# Patient Record
Sex: Female | Born: 1965 | Race: White | Hispanic: No | Marital: Single | Smoking: Never smoker
Health system: Southern US, Community
[De-identification: ages and names within clinical notes are randomized; demographics above are authoritative.]

## PROBLEM LIST (undated history)

## (undated) DIAGNOSIS — Z8489 Family history of other specified conditions: Secondary | ICD-10-CM

## (undated) DIAGNOSIS — Z8709 Personal history of other diseases of the respiratory system: Secondary | ICD-10-CM

## (undated) DIAGNOSIS — R519 Headache, unspecified: Secondary | ICD-10-CM

## (undated) DIAGNOSIS — E785 Hyperlipidemia, unspecified: Secondary | ICD-10-CM

## (undated) DIAGNOSIS — N920 Excessive and frequent menstruation with regular cycle: Secondary | ICD-10-CM

## (undated) DIAGNOSIS — D649 Anemia, unspecified: Secondary | ICD-10-CM

## (undated) DIAGNOSIS — N83201 Unspecified ovarian cyst, right side: Secondary | ICD-10-CM

## (undated) DIAGNOSIS — E039 Hypothyroidism, unspecified: Secondary | ICD-10-CM

## (undated) HISTORY — DX: Personal history of other diseases of the respiratory system: Z87.09

## (undated) HISTORY — DX: Hyperlipidemia, unspecified: E78.5

## (undated) HISTORY — DX: Hypothyroidism, unspecified: E03.9

## (undated) HISTORY — PX: OTHER SURGICAL HISTORY: SHX169

---

## 1998-04-06 ENCOUNTER — Other Ambulatory Visit: Admission: RE | Admit: 1998-04-06 | Discharge: 1998-04-06 | Payer: Self-pay | Admitting: Obstetrics and Gynecology

## 2003-10-29 ENCOUNTER — Other Ambulatory Visit: Admission: RE | Admit: 2003-10-29 | Discharge: 2003-10-29 | Payer: Self-pay | Admitting: Family Medicine

## 2005-12-28 ENCOUNTER — Ambulatory Visit: Payer: Self-pay | Admitting: Pulmonary Disease

## 2006-01-04 ENCOUNTER — Ambulatory Visit: Payer: Self-pay | Admitting: Pulmonary Disease

## 2006-01-08 ENCOUNTER — Other Ambulatory Visit: Admission: RE | Admit: 2006-01-08 | Discharge: 2006-01-08 | Payer: Self-pay | Admitting: Family Medicine

## 2006-05-10 ENCOUNTER — Ambulatory Visit: Payer: Self-pay | Admitting: Pulmonary Disease

## 2006-05-10 LAB — CONVERTED CEMR LAB: TSH: 1.95 microintl units/mL (ref 0.35–5.50)

## 2007-06-04 ENCOUNTER — Ambulatory Visit: Payer: Self-pay | Admitting: Pulmonary Disease

## 2007-06-04 DIAGNOSIS — E039 Hypothyroidism, unspecified: Secondary | ICD-10-CM | POA: Insufficient documentation

## 2007-06-04 DIAGNOSIS — J309 Allergic rhinitis, unspecified: Secondary | ICD-10-CM | POA: Insufficient documentation

## 2007-06-08 DIAGNOSIS — J45909 Unspecified asthma, uncomplicated: Secondary | ICD-10-CM | POA: Insufficient documentation

## 2007-06-11 ENCOUNTER — Ambulatory Visit: Payer: Self-pay | Admitting: Pulmonary Disease

## 2007-06-27 ENCOUNTER — Telehealth (INDEPENDENT_AMBULATORY_CARE_PROVIDER_SITE_OTHER): Payer: Self-pay | Admitting: *Deleted

## 2007-07-26 LAB — CONVERTED CEMR LAB
ALT: 19 units/L (ref 0–35)
AST: 24 units/L (ref 0–37)
Albumin: 3.9 g/dL (ref 3.5–5.2)
Alkaline Phosphatase: 48 units/L (ref 39–117)
BUN: 13 mg/dL (ref 6–23)
Basophils Absolute: 0 10*3/uL (ref 0.0–0.1)
Basophils Relative: 0.1 % (ref 0.0–1.0)
Bilirubin Urine: NEGATIVE
Bilirubin, Direct: 0.1 mg/dL (ref 0.0–0.3)
CO2: 30 meq/L (ref 19–32)
Calcium: 8.8 mg/dL (ref 8.4–10.5)
Chloride: 102 meq/L (ref 96–112)
Cholesterol: 194 mg/dL (ref 0–200)
Creatinine, Ser: 0.6 mg/dL (ref 0.4–1.2)
Crystals: NEGATIVE
Eosinophils Absolute: 0.5 10*3/uL (ref 0.0–0.6)
Eosinophils Relative: 8.3 % — ABNORMAL HIGH (ref 0.0–5.0)
GFR calc Af Amer: 142 mL/min
GFR calc non Af Amer: 118 mL/min
Glucose, Bld: 96 mg/dL (ref 70–99)
HCT: 35.8 % — ABNORMAL LOW (ref 36.0–46.0)
HDL: 47.4 mg/dL (ref >39.0)
Hemoglobin: 12.4 g/dL (ref 12.0–15.0)
Ketones, ur: NEGATIVE mg/dL
LDL Cholesterol: 127 mg/dL — ABNORMAL HIGH (ref 0–99)
Lymphocytes Relative: 28 % (ref 12.0–46.0)
MCHC: 34.6 g/dL (ref 30.0–36.0)
MCV: 82.8 fL (ref 78.0–100.0)
Monocytes Absolute: 0.4 10*3/uL (ref 0.2–0.7)
Monocytes Relative: 6.8 % (ref 3.0–11.0)
Neutro Abs: 3.6 10*3/uL (ref 1.4–7.7)
Neutrophils Relative %: 56.8 % (ref 43.0–77.0)
Nitrite: NEGATIVE
Platelets: 263 10*3/uL (ref 150–400)
Potassium: 3.4 meq/L — ABNORMAL LOW (ref 3.5–5.1)
RBC: 4.32 M/uL (ref 3.87–5.11)
RDW: 12.5 % (ref 11.5–14.6)
Sodium: 139 meq/L (ref 135–145)
Specific Gravity, Urine: 1.015 (ref 1.000–1.03)
T4, Total: 7.4 ug/dL (ref 5.0–12.5)
TSH: 3.79 microintl units/mL (ref 0.35–5.50)
Total Bilirubin: 0.5 mg/dL (ref 0.3–1.2)
Total CHOL/HDL Ratio: 4.1
Total Protein, Urine: NEGATIVE mg/dL
Total Protein: 7.1 g/dL (ref 6.0–8.3)
Triglycerides: 100 mg/dL (ref 0–149)
Urine Glucose: NEGATIVE mg/dL
Urobilinogen, UA: 0.2 (ref 0.0–1.0)
VLDL: 20 mg/dL (ref 0–40)
WBC: 6.3 10*3/uL (ref 4.5–10.5)
pH: 7 (ref 5.0–8.0)

## 2008-07-01 ENCOUNTER — Ambulatory Visit: Payer: Self-pay | Admitting: Pulmonary Disease

## 2008-07-10 ENCOUNTER — Ambulatory Visit: Payer: Self-pay | Admitting: Pulmonary Disease

## 2008-07-15 ENCOUNTER — Ambulatory Visit: Payer: Self-pay | Admitting: Pulmonary Disease

## 2008-07-15 LAB — CONVERTED CEMR LAB
Fecal Occult Blood: NEGATIVE
OCCULT 1: NEGATIVE
OCCULT 2: NEGATIVE
OCCULT 3: NEGATIVE
OCCULT 4: NEGATIVE
OCCULT 5: NEGATIVE

## 2008-07-19 LAB — CONVERTED CEMR LAB
ALT: 13 units/L (ref 0–35)
AST: 20 units/L (ref 0–37)
Albumin: 4 g/dL (ref 3.5–5.2)
Alkaline Phosphatase: 47 units/L (ref 39–117)
BUN: 17 mg/dL (ref 6–23)
Basophils Absolute: 0 10*3/uL (ref 0.0–0.1)
Basophils Relative: 0.9 % (ref 0.0–3.0)
Bilirubin Urine: NEGATIVE
Bilirubin, Direct: 0.1 mg/dL (ref 0.0–0.3)
CO2: 29 meq/L (ref 19–32)
Calcium: 8.9 mg/dL (ref 8.4–10.5)
Chloride: 98 meq/L (ref 96–112)
Cholesterol: 191 mg/dL (ref 0–200)
Creatinine, Ser: 0.8 mg/dL (ref 0.4–1.2)
Eosinophils Absolute: 0.4 10*3/uL (ref 0.0–0.7)
Eosinophils Relative: 7 % — ABNORMAL HIGH (ref 0.0–5.0)
GFR calc Af Amer: 102 mL/min
GFR calc non Af Amer: 84 mL/min
Glucose, Bld: 94 mg/dL (ref 70–99)
HCT: 36.2 % (ref 36.0–46.0)
HDL: 44.6 mg/dL (ref >39.0)
Hemoglobin, Urine: NEGATIVE
Hemoglobin: 12.2 g/dL (ref 12.0–15.0)
Ketones, ur: NEGATIVE mg/dL
LDL Cholesterol: 127 mg/dL — ABNORMAL HIGH (ref 0–99)
Leukocytes, UA: NEGATIVE
Lymphocytes Relative: 32.2 % (ref 12.0–46.0)
MCHC: 33.6 g/dL (ref 30.0–36.0)
MCV: 82.2 fL (ref 78.0–100.0)
Monocytes Absolute: 0.5 10*3/uL (ref 0.1–1.0)
Monocytes Relative: 8.4 % (ref 3.0–12.0)
Neutro Abs: 2.8 10*3/uL (ref 1.4–7.7)
Neutrophils Relative %: 51.5 % (ref 43.0–77.0)
Nitrite: NEGATIVE
Platelets: 250 10*3/uL (ref 150–400)
Potassium: 3.9 meq/L (ref 3.5–5.1)
RBC: 4.4 M/uL (ref 3.87–5.11)
RDW: 13.6 % (ref 11.5–14.6)
Sodium: 134 meq/L — ABNORMAL LOW (ref 135–145)
Specific Gravity, Urine: <=1.005 (ref 1.000–1.03)
TSH: 2.76 microintl units/mL (ref 0.35–5.50)
Total Bilirubin: 0.8 mg/dL (ref 0.3–1.2)
Total CHOL/HDL Ratio: 4.3
Total Protein, Urine: NEGATIVE mg/dL
Total Protein: 6.8 g/dL (ref 6.0–8.3)
Triglycerides: 95 mg/dL (ref 0–149)
Urine Glucose: NEGATIVE mg/dL
Urobilinogen, UA: 0.2 (ref 0.0–1.0)
VLDL: 19 mg/dL (ref 0–40)
WBC: 5.5 10*3/uL (ref 4.5–10.5)
pH: 6 (ref 5.0–8.0)

## 2009-07-01 ENCOUNTER — Ambulatory Visit: Payer: Self-pay | Admitting: Pulmonary Disease

## 2009-07-01 DIAGNOSIS — E785 Hyperlipidemia, unspecified: Secondary | ICD-10-CM | POA: Insufficient documentation

## 2009-07-09 ENCOUNTER — Ambulatory Visit: Payer: Self-pay | Admitting: Pulmonary Disease

## 2009-07-16 LAB — CONVERTED CEMR LAB
ALT: 38 units/L — ABNORMAL HIGH (ref 0–35)
AST: 42 units/L — ABNORMAL HIGH (ref 0–37)
Albumin: 4.5 g/dL (ref 3.5–5.2)
Alkaline Phosphatase: 50 units/L (ref 39–117)
BUN: 7 mg/dL (ref 6–23)
Basophils Absolute: 0.1 10*3/uL (ref 0.0–0.1)
Basophils Relative: 0.8 % (ref 0.0–3.0)
Bilirubin Urine: NEGATIVE
Bilirubin, Direct: 0.1 mg/dL (ref 0.0–0.3)
CO2: 31 meq/L (ref 19–32)
Calcium: 9.4 mg/dL (ref 8.4–10.5)
Chloride: 100 meq/L (ref 96–112)
Cholesterol: 197 mg/dL (ref 0–200)
Creatinine, Ser: 0.7 mg/dL (ref 0.4–1.2)
Eosinophils Absolute: 0.3 10*3/uL (ref 0.0–0.7)
Eosinophils Relative: 5.3 % — ABNORMAL HIGH (ref 0.0–5.0)
GFR calc non Af Amer: 97.11 mL/min (ref >60)
Glucose, Bld: 87 mg/dL (ref 70–99)
HCT: 37.3 % (ref 36.0–46.0)
HDL: 49 mg/dL (ref >39.00)
Hemoglobin, Urine: NEGATIVE
Hemoglobin: 12.3 g/dL (ref 12.0–15.0)
Ketones, ur: NEGATIVE mg/dL
LDL Cholesterol: 126 mg/dL — ABNORMAL HIGH (ref 0–99)
Leukocytes, UA: NEGATIVE
Lymphocytes Relative: 27.3 % (ref 12.0–46.0)
Lymphs Abs: 1.7 10*3/uL (ref 0.7–4.0)
MCHC: 33.1 g/dL (ref 30.0–36.0)
MCV: 82 fL (ref 78.0–100.0)
Monocytes Absolute: 0.3 10*3/uL (ref 0.1–1.0)
Monocytes Relative: 5.1 % (ref 3.0–12.0)
Neutro Abs: 4 10*3/uL (ref 1.4–7.7)
Neutrophils Relative %: 61.5 % (ref 43.0–77.0)
Nitrite: NEGATIVE
Platelets: 320 10*3/uL (ref 150.0–400.0)
Potassium: 4.2 meq/L (ref 3.5–5.1)
RBC: 4.55 M/uL (ref 3.87–5.11)
RDW: 12.6 % (ref 11.5–14.6)
Sodium: 137 meq/L (ref 135–145)
Specific Gravity, Urine: <=1.005 (ref 1.000–1.030)
TSH: 3.22 microintl units/mL (ref 0.35–5.50)
Total Bilirubin: 0.6 mg/dL (ref 0.3–1.2)
Total CHOL/HDL Ratio: 4
Total Protein, Urine: NEGATIVE mg/dL
Total Protein: 7.8 g/dL (ref 6.0–8.3)
Triglycerides: 108 mg/dL (ref 0.0–149.0)
Urine Glucose: NEGATIVE mg/dL
Urobilinogen, UA: 0.2 (ref 0.0–1.0)
VLDL: 21.6 mg/dL (ref 0.0–40.0)
WBC: 6.4 10*3/uL (ref 4.5–10.5)
pH: 7 (ref 5.0–8.0)

## 2009-08-11 ENCOUNTER — Ambulatory Visit: Payer: Self-pay | Admitting: Pulmonary Disease

## 2009-08-16 LAB — CONVERTED CEMR LAB
Fecal Occult Blood: NEGATIVE
OCCULT 1: NEGATIVE
OCCULT 2: NEGATIVE
OCCULT 3: NEGATIVE
OCCULT 4: NEGATIVE
OCCULT 5: NEGATIVE

## 2009-09-02 ENCOUNTER — Other Ambulatory Visit: Admission: RE | Admit: 2009-09-02 | Discharge: 2009-09-02 | Payer: Self-pay | Admitting: Family Medicine

## 2010-07-12 NOTE — Progress Notes (Signed)
Summary: LMTCB NEED LAB RESULTS  Medications Added SYNTHROID 88 MCG TABS (LEVOTHYROXINE SODIUM)        Phone Note Call from Patient   Caller: Patient Call For: NADEL Summary of Call: CALLING TO GET LAB RESULTS PATIENT'S CHART HAS BEEN REQUESTED  Initial call taken by: Rickard Patience,  June 27, 2007 10:41 AM  Follow-up for Phone Call        labs are okay, had some bacteria in UA-suspect in okay, if sx recheck UA and UCx  Follow-up by: Rubye Oaks NP,  June 27, 2007 3:33 PM  Additional Follow-up for Phone Call Additional follow up Details #1::        LMTCB ..................................................................Marland KitchenCloyde Reams RN  June 27, 2007 3:50 PM  Called spoke with pt advised of lab results.  Pt denies any symptoms or signs of a UTI.   Additional Follow-up by: Cloyde Reams RN,  June 27, 2007 4:55 PM    New/Updated Medications: SYNTHROID 88 MCG TABS (LEVOTHYROXINE SODIUM)

## 2010-07-12 NOTE — Assessment & Plan Note (Signed)
Summary: cpx 1 yr///kp   CC:  Yearly ROV & CPX....  History of Present Illness: 45 y/o WF here for a routine CPX... she is the daughter of Kim [name] and the step-daughter of Kim [name]...    ~  July 01, 2009:  last seen 1/10 for CPX doing satis on Levoxyl for hypothy... she's had a good yr- no new complaints or concerns... 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 currently doing part time jobs til something comes avail (stressful)...    Current Problems:   PHYSICAL EXAMINATION (ICD-V70.0) - her GYN is Kim [name] at [facility] for PAPs, etc (she is on Naprosyn for cramps)... she gets her Mammograms at [facility] and she will have copies forwarded to [place]... IMMUNIZATIONS: she had a tetanus shot in 2001 & the 2010 Flu vaccine in Oct2010...  ALLERGIC RHINITIS (ICD-477.9) - allergic to trees, grass, mold, dust, ragweed, & pets- on shots x yrs from Kim [name], and uses Flonase, Astelin, Antihist Prn.  Hx of ASTHMA (ICD-493.90) - hx of asthma as a child, no prob x yrs.  HYPERCHOLESTEROLEMIA, BORDERLINE (ICD-272.4) - on diet alone...  ~  FLP 1/10 showed TChol 191, TG 95, HDL 45, LDL 127... discussed diet Rx.  ~  FLP 1/11 showed TChol =   HYPOTHYROIDISM (ICD-244.9) - on LEVOXYL 3mcg/[name]...  ~  routine labs 7/07 w/ TSH 10.9 & Synthroid started...  ~  labs 1/10 on Levothy88 showed TSH= 2.76... continue same.  ~  labs 1/11 showed TSH=     Allergies (verified): No Known Drug Allergies  Comments:  Nurse/Medical Assistant: The patient's medications and allergies were reviewed with the patient and were updated in the Medication and Allergy Lists.  Past History:  Past Medical History:  ALLERGIC RHINITIS (ICD-477.9) Hx of ASTHMA (ICD-493.90) HYPERCHOLESTEROLEMIA, BORDERLINE (ICD-272.4) HYPOTHYROIDISM (ICD-244.9)  Family History: Reviewed history from 07/01/2008 and no changes required. Father alive age 71 w/ prostate cancer, smoker, overweight (Kim [name]'s  husb) Mother alive age 68- Kim [name] No siblings   Social History: Reviewed history from 07/01/2008 and no changes required. 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  Review of Systems  The patient denies fever, chills, sweats, anorexia, fatigue, weakness, malaise, weight loss, sleep disorder, blurring, diplopia, eye irritation, eye discharge, vision loss, eye pain, photophobia, earache, ear discharge, tinnitus, decreased hearing, nasal congestion, nosebleeds, sore throat, hoarseness, chest pain, palpitations, syncope, dyspnea on exertion, orthopnea, PND, peripheral edema, cough, dyspnea at rest, excessive sputum, hemoptysis, wheezing, pleurisy, nausea, vomiting, diarrhea, constipation, change in bowel habits, abdominal pain, melena, hematochezia, jaundice, gas/bloating, indigestion/heartburn, dysphagia, odynophagia, dysuria, hematuria, urinary frequency, urinary hesitancy, nocturia, erectile dysfunction, back pain, joint pain, joint swelling, muscle cramps, muscle weakness, stiffness, arthritis, sciatica, restless legs, leg pain at night, leg pain with exertion, rash, itching, dryness, suspicious lesions, paralysis, paresthesias, seizures, tremors, vertigo, transient blindness, frequent falls, frequent headaches, difficulty walking, depression, anxiety, memory loss, confusion, cold intolerance, heat intolerance, polydipsia, polyphagia, polyuria, unusual weight change, abnormal bruising, bleeding, enlarged lymph nodes, urticaria, allergic rash, hay fever, and recurrent infections.    Vital Signs:  Patient profile:   45 year old female Height:      65 inches Weight:      130.13 pounds BMI:     21.73 O2 Sat:      99 % on Room air Temp:     97.0 degrees F oral Pulse rate:   108 / minute BP sitting:  138 / 84  (right arm) Cuff size:   regular  Vitals Entered By: [name] CMA (July 01, 2009 9:13 AM)  O2 Sat at Rest %:   99 O2 Flow:  Room air CC: Yearly ROV & CPX... Is Patient Diabetic? No Pain Assessment Patient in pain? no      Comments no changes in meds at this time   Physical Exam  Additional Exam:  WD, WN, 45 y/o WF in NAD... GENERAL:  Alert & oriented; pleasant & cooperative. HEENT:  Kim [name]/AT, EOM-wnl, PERRLA, Fundi-benign, EACs-clear, TMs-wnl, NOSE-clear, THROAT-clear & wnl. NECK:  Supple w/ full ROM; no JVD; normal carotid impulses w/o bruits; no thyromegaly or nodules palpated; no lymphadenopathy. CHEST:  Clear to P & A; without wheezes/ rales/ or rhonchi. HEART:  Regular Rhythm; without murmurs/ rubs/ or gallops. ABDOMEN:  Soft & nontender; normal bowel sounds; no organomegaly or masses detected. EXT: without deformities or arthritic changes; no varicose veins/ venous insuffic/ or edema. NEURO:  CN's intact; motor testing normal; sensory testing normal; gait normal & balance OK. DERM:  No lesions noted; no rash etc...     MISC. Report  Procedure date:  07/01/2009  Findings:      She will return to our lab for FASTING blood work...  SN   Impression & Recommendations:  Problem # 1:  PHYSICAL EXAMINATION (ICD-V70.0) She will ret for fasting labs...  Problem # 2:  ALLERGIC RHINITIS (ICD-477.9) Stable-  allergy testing from Kim [name] in past...  Problem # 3:  Hx of ASTHMA (ICD-493.90) No problem x yrs...  Problem # 4:  HYPERCHOLESTEROLEMIA, BORDERLINE (ICD-272.4) She will ret for FLP... continue diet Rx...  Problem # 5:  HYPOTHYROIDISM (ICD-244.9) Levoxyl refilled... Her updated medication list for this problem includes:    Levothyroxine Sodium 88 Mcg Tabs (Levothyroxine sodium) .Marland Kitchen... 1 tab daily  Complete Medication List: 1)  Levothyroxine Sodium 88 Mcg Tabs (Levothyroxine sodium) .Marland Kitchen.. 1 tab daily 2)  Naproxen Dr 500 Mg Tbec (Naproxen) .... As needed for monthly cramps  Other Orders: EKG w/ Interpretation (93000) Prescription Created Electronically [phone])  Patient  Instructions: 1)  Today we updated your med list- see below.... 2)  We refilled your Levoxyl for 2011... 3)  Please return to our lab one morning next week for your fasting blood work...  4)  Please call the "phone tree" in a few days for your lab results.Marland KitchenMarland Kitchen  5)  Don't forget to collect the stool cards at your convenience & mail them back to [place] so that we may check for any hidden blood... 6)  Call for any problems.Marland KitchenMarland Kitchen 7)  Please schedule a follow-up appointment in 1 year. Prescriptions: LEVOTHYROXINE SODIUM 88 MCG  TABS (LEVOTHYROXINE SODIUM) 1 tab daily  #90 x prn   Entered and Authorized by:   Michele Mcalpine MD   Signed by:   Michele Mcalpine MD on 07/01/2009   Method used:   Print then Give to Patient   RxID:   [id number]    CardioPerfect ECG  ID: [id number] Patient: Kim [name] DOB: 01/22/1966 Age: 45 Years Old Sex: Female Race: White Physician: Kelin Borum,s Technician: [name] CMA Height: 65 Weight: 130.13 Status: Unconfirmed Past Medical History:   ALLERGIC RHINITIS (ICD-477.9) Hx of ASTHMA (ICD-493.90) HYPOTHYROIDISM (ICD-244.9)   Recorded: 07/01/2009 09:26 AM P/PR: 95 ms / 135 ms - Heart rate (maximum exercise) QRS: 75 QT/QTc/QTd: 337 ms / 402 ms / 19 ms - Heart rate (maximum exercise)  P/QRS/T axis: 72 deg / 82  deg / 59 deg - Heart rate (maximum exercise)  Heartrate: 97 bpm  Interpretation:   sinus rhythm (rapid)  vertical axis   Normal variant of ECG

## 2010-08-02 ENCOUNTER — Other Ambulatory Visit: Payer: Self-pay | Admitting: Pulmonary Disease

## 2010-08-02 ENCOUNTER — Ambulatory Visit (INDEPENDENT_AMBULATORY_CARE_PROVIDER_SITE_OTHER): Payer: BC Managed Care – PPO | Admitting: Pulmonary Disease

## 2010-08-02 ENCOUNTER — Encounter: Payer: Self-pay | Admitting: Pulmonary Disease

## 2010-08-02 ENCOUNTER — Ambulatory Visit (INDEPENDENT_AMBULATORY_CARE_PROVIDER_SITE_OTHER)
Admission: RE | Admit: 2010-08-02 | Discharge: 2010-08-02 | Disposition: A | Discharge status: Home / Self Care | Payer: BC Managed Care – PPO | Source: Ambulatory Visit | Attending: Pulmonary Disease | Admitting: Pulmonary Disease

## 2010-08-02 DIAGNOSIS — Z0000 Encounter for general adult medical examination without abnormal findings: Secondary | ICD-10-CM

## 2010-08-02 DIAGNOSIS — Z23 Encounter for immunization: Secondary | ICD-10-CM

## 2010-08-09 ENCOUNTER — Other Ambulatory Visit: Payer: BC Managed Care – PPO

## 2010-08-09 ENCOUNTER — Encounter (INDEPENDENT_AMBULATORY_CARE_PROVIDER_SITE_OTHER): Payer: Self-pay | Admitting: *Deleted

## 2010-08-09 ENCOUNTER — Other Ambulatory Visit: Payer: Self-pay | Admitting: Pulmonary Disease

## 2010-08-09 DIAGNOSIS — E785 Hyperlipidemia, unspecified: Secondary | ICD-10-CM

## 2010-08-09 DIAGNOSIS — Z0000 Encounter for general adult medical examination without abnormal findings: Secondary | ICD-10-CM

## 2010-08-09 LAB — HEPATIC FUNCTION PANEL
ALT: 15 U/L (ref 0–35)
AST: 25 U/L (ref 0–37)
Albumin: 4.2 g/dL (ref 3.5–5.2)
Alkaline Phosphatase: 51 U/L (ref 39–117)
Bilirubin, Direct: 0.1 mg/dL (ref 0.0–0.3)
Total Bilirubin: 0.5 mg/dL (ref 0.3–1.2)
Total Protein: 7 g/dL (ref 6.0–8.3)

## 2010-08-09 LAB — BASIC METABOLIC PANEL
BUN: 14 mg/dL (ref 6–23)
CO2: 27 mEq/L (ref 19–32)
Calcium: 8.9 mg/dL (ref 8.4–10.5)
Chloride: 97 mEq/L (ref 96–112)
Creatinine, Ser: 0.7 mg/dL (ref 0.4–1.2)
GFR: 101.63 mL/min (ref >60.00)
Glucose, Bld: 79 mg/dL (ref 70–99)
Potassium: 4.4 mEq/L (ref 3.5–5.1)
Sodium: 132 mEq/L — ABNORMAL LOW (ref 135–145)

## 2010-08-09 LAB — LDL CHOLESTEROL, DIRECT: Direct LDL: 135.5 mg/dL

## 2010-08-09 LAB — URINALYSIS
Bilirubin Urine: NEGATIVE
Hgb urine dipstick: NEGATIVE
Ketones, ur: NEGATIVE
Leukocytes, UA: NEGATIVE
Nitrite: NEGATIVE
Specific Gravity, Urine: <=1.005 (ref 1.000–1.030)
Total Protein, Urine: NEGATIVE
Urine Glucose: NEGATIVE
Urobilinogen, UA: 0.2 (ref 0.0–1.0)
pH: 7 (ref 5.0–8.0)

## 2010-08-09 LAB — LIPID PANEL
Cholesterol: 204 mg/dL — ABNORMAL HIGH (ref 0–200)
HDL: 53.3 mg/dL (ref >39.00)
Total CHOL/HDL Ratio: 4
Triglycerides: 99 mg/dL (ref 0.0–149.0)
VLDL: 19.8 mg/dL (ref 0.0–40.0)

## 2010-08-09 LAB — CBC WITH DIFFERENTIAL/PLATELET
Basophils Absolute: 0.1 10*3/uL (ref 0.0–0.1)
Basophils Relative: 0.9 % (ref 0.0–3.0)
Eosinophils Absolute: 0.3 10*3/uL (ref 0.0–0.7)
Eosinophils Relative: 5.6 % — ABNORMAL HIGH (ref 0.0–5.0)
HCT: 33.4 % — ABNORMAL LOW (ref 36.0–46.0)
Hemoglobin: 10.9 g/dL — ABNORMAL LOW (ref 12.0–15.0)
Lymphocytes Relative: 29.6 % (ref 12.0–46.0)
Lymphs Abs: 1.8 10*3/uL (ref 0.7–4.0)
MCHC: 32.7 g/dL (ref 30.0–36.0)
MCV: 75.6 fl — ABNORMAL LOW (ref 78.0–100.0)
Monocytes Absolute: 0.5 10*3/uL (ref 0.1–1.0)
Monocytes Relative: 8.6 % (ref 3.0–12.0)
Neutro Abs: 3.4 10*3/uL (ref 1.4–7.7)
Neutrophils Relative %: 55.3 % (ref 43.0–77.0)
Platelets: 287 10*3/uL (ref 150.0–400.0)
RBC: 4.42 Mil/uL (ref 3.87–5.11)
RDW: 16.3 % — ABNORMAL HIGH (ref 11.5–14.6)
WBC: 6.2 10*3/uL (ref 4.5–10.5)

## 2010-08-10 ENCOUNTER — Telehealth (INDEPENDENT_AMBULATORY_CARE_PROVIDER_SITE_OTHER): Payer: Self-pay | Admitting: *Deleted

## 2010-08-16 ENCOUNTER — Encounter (INDEPENDENT_AMBULATORY_CARE_PROVIDER_SITE_OTHER): Payer: Self-pay | Admitting: *Deleted

## 2010-08-16 ENCOUNTER — Other Ambulatory Visit: Payer: Self-pay | Admitting: Pulmonary Disease

## 2010-08-16 ENCOUNTER — Other Ambulatory Visit: Payer: BC Managed Care – PPO

## 2010-08-16 DIAGNOSIS — E039 Hypothyroidism, unspecified: Secondary | ICD-10-CM

## 2010-08-16 LAB — TSH: TSH: 2.8 u[IU]/mL (ref 0.35–5.50)

## 2010-08-18 NOTE — Progress Notes (Signed)
Summary: pt needs to return for redraw of TSH > 3.6.12  Phone Note Outgoing Call Call back at [facility] Phone [phone]   Call placed by: Boone Master CNA/MA,  August 10, 2010 12:06 PM Call placed to: Patient Summary of Call: received phone call from [facility] in the lab on 2.28.12 @ approx 5:30pm.  per Archie Patten, there was not enough blood left from pt's other labs to have the tsh drawn.  pt needs to return for tsh.  LMOM TCB x1.  Follow-up for Phone Call        pt returned my call.  informed pt of above.  pt verbalized her understanding and will return to have her tsh drawn on 3.6.12.  pt call if this date will not work for her so that it may be resched.  appt sched in epic. Boone Master CNA/MA  August 10, 2010 4:55 PM

## 2010-08-18 NOTE — Assessment & Plan Note (Signed)
Summary: physcial/apc   CC:  Yearly ROV & CPX....  History of Present Illness: 45 y/o WF here for a routine CPX... she is the daughter of Di Kindle and the step-daughter of Lorea Kupfer...    ~  July 01, 2009:  last seen 1/10 for CPX doing satis on Levoxyl for hypothy... she's had a good yr- no new complaints or concerns... 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 currently doing part time jobs til something comes avail (stressful).   ~  August 02, 2010:  66mo ROV- doing well w/o new complaints or concerns;  requests refill for 90d supplies... she has an essentially neg ROS;  she sees [facility] [place] [facility] [place] each spring & doing satis... she remains on allergy shots per DrESL & doing satis, no asthma attacks etc... Chol remains under fair control on diet & exercise;  Thyroid is well regulated on Levothy79mcg/d...    Current Problems:   PHYSICAL EXAMINATION (ICD-V70.0) - her GYN is DrCSmith at [facility] for PAPs, etc (she is on Naprosyn for cramps)... she gets her Mammograms at [facility] and she will have copies forwarded to [place]... IMMUNIZATIONS: she had a tetanus shot in 2001 & gets the yearly Flu vaccines...  ~  2/12:  given TDAP in office today...  ALLERGIC RHINITIS (ICD-477.9) - allergic to trees, grass, mold, dust, ragweed, & pets- on shots x yrs from DrESL, and uses Flonase, Astelin, Antihist Prn.  Hx of ASTHMA (ICD-493.90) - hx of asthma as a child, no prob x yrs.  HYPERCHOLESTEROLEMIA, BORDERLINE (ICD-272.4) - on diet alone...  ~  FLP 1/10 showed TChol 191, TG 95, HDL 45, LDL 127... discussed diet Rx.  ~  FLP 1/11 showed TChol 197, TG108, HDL 49, LDL 126  ~  FLP 2/12 = pending  HYPOTHYROIDISM (ICD-244.9) - on LEVOXYL 83mcg/d...  ~  routine labs 7/07 w/ TSH 10.9 & Synthroid started...  ~  labs 1/10 on Levothy88 showed TSH= 2.76... continue same.  ~  labs 1/11 on Levothy88 showed TSH= 3.22  ~  labs 2/12 on Levothy88 showed = pending   Preventive  Screening-Counseling & Management  Alcohol-Tobacco     Smoking Status: never  Allergies (verified): No Known Drug Allergies  Comments:  Nurse/Medical Assistant: The patient's medications and allergies were reviewed with the patient and were updated in the Medication and Allergy Lists.  Past History:  Past Medical History: ALLERGIC RHINITIS (ICD-477.9) Hx of ASTHMA (ICD-493.90) HYPERCHOLESTEROLEMIA, BORDERLINE (ICD-272.4) HYPOTHYROIDISM (ICD-244.9)  Family History: Reviewed history from 07/01/2008 and no changes required. Father alive age 65 w/ prostate cancer, smoker, overweight ([name]'s husb) Mother alive age 61- Di Kindle No siblings  Social History: Reviewed history from 07/01/2008 and no changes required. 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  Review of Systems  The patient denies fever, chills, sweats, anorexia, fatigue, weakness, malaise, weight loss, sleep disorder, blurring, diplopia, eye irritation, eye discharge, vision loss, eye pain, photophobia, earache, ear discharge, tinnitus, decreased hearing, nasal congestion, nosebleeds, sore throat, hoarseness, chest pain, palpitations, syncope, dyspnea on exertion, orthopnea, PND, peripheral edema, cough, dyspnea at rest, excessive sputum, hemoptysis, wheezing, pleurisy, nausea, vomiting, diarrhea, constipation, change in bowel habits, abdominal pain, melena, hematochezia, jaundice, gas/bloating, indigestion/heartburn, dysphagia, odynophagia, dysuria, hematuria, urinary frequency, urinary hesitancy, nocturia, incontinence, back pain, joint pain, joint swelling, muscle cramps, muscle weakness, stiffness, arthritis, sciatica, restless legs, leg pain at night, leg pain with exertion, rash, itching,  dryness, suspicious lesions, paralysis, paresthesias, seizures, tremors, vertigo, transient blindness, frequent falls, frequent headaches, difficulty walking,  depression, anxiety, memory loss, confusion, cold intolerance, heat intolerance, polydipsia, polyphagia, polyuria, unusual weight change, abnormal bruising, bleeding, enlarged lymph nodes, urticaria, allergic rash, hay fever, and recurrent infections.    Vital Signs:  Patient profile:   45 year old female Height:      65 inches Weight:      130 pounds BMI:     21.71 O2 Sat:      100 % on Room air Temp:     96.8 degrees F oral Pulse rate:   82 / minute BP sitting:   140 / 82  (right arm) Cuff size:   regular  Vitals Entered By: [name] CMA (August 02, 2010 9:38 AM)  O2 Sat at Rest %:  100 O2 Flow:  Room air CC: Yearly ROV & CPX... Is Patient Diabetic? No Pain Assessment Patient in pain? no      Comments meds updated today with pt---no changes   Physical Exam  Additional Exam:  WD, WN, 45 y/o WF in NAD... GENERAL:  Alert & oriented; pleasant & cooperative. HEENT:  [place]/AT, EOM-wnl, PERRLA, Fundi-benign, EACs-clear, TMs-wnl, NOSE-clear, THROAT-clear & wnl. NECK:  Supple w/ full ROM; no JVD; normal carotid impulses w/o bruits; no thyromegaly or nodules palpated; no lymphadenopathy. CHEST:  Clear to P & A; without wheezes/ rales/ or rhonchi. HEART:  Regular Rhythm; without murmurs/ rubs/ or gallops. ABDOMEN:  Soft & nontender; normal bowel sounds; no organomegaly or masses detected. EXT: without deformities or arthritic changes; no varicose veins/ venous insuffic/ or edema. NEURO:  CN's intact; motor testing normal; sensory testing normal; gait normal & balance OK. DERM:  No lesions noted; no rash etc...    Impression & Recommendations:  Problem # 1:  PHYSICAL EXAMINATION (ICD-V70.0) She will return for FASTING blood work... Orders: EKG w/ Interpretation (93000) T-2 View CXR (71020TC)  Problem # 2:  ALLERGIC RHINITIS (ICD-477.9) Followed byDrESL on allergy shots...  Problem # 3:  Hx of ASTHMA (ICD-493.90) No resp problems since childhood...  Problem # 4:   HYPERCHOLESTEROLEMIA, BORDERLINE (ICD-272.4) On diet + exercise program...  Problem # 5:  HYPOTHYROIDISM (ICD-244.9) Stable on Levo88>  continue same... Her updated medication list for this problem includes:    Levothyroxine Sodium 88 Mcg Tabs (Levothyroxine sodium) .Marland Kitchen... 1 tab daily  Problem # 6:  OTHER MEDICAL PROBLEMS AS NOTED>>>  Complete Medication List: 1)  Levothyroxine Sodium 88 Mcg Tabs (Levothyroxine sodium) .Marland Kitchen.. 1 tab daily 2)  Naproxen Dr 500 Mg Tbec (Naproxen) .... As needed for monthly cramps  Other Orders: Tdap => 42yrs IM (16109) Admin 1st Vaccine (60454)  Patient Instructions: 1)  Today we updated your med list- see below.... 2)  We refilled your Thyroid medication for 2012... 3)  Today we did your follow up CXR & EKG... 4)  Please return to our lab one morning this week for your FASTING blood work... then call the "phone tree" in a few days for your lab results.Marland KitchenMarland Kitchen  5)  We gave you the combination Tetanus vaccine called the TDAP today (good for 35yrs)... 6)  Call for any problems.Marland KitchenMarland Kitchen 7)  Please schedule a follow-up appointment in 1 year. Prescriptions: LEVOTHYROXINE SODIUM 88 MCG  TABS (LEVOTHYROXINE SODIUM) 1 tab daily  #90 x prn   Entered and Authorized by:   Michele Mcalpine MD   Signed by:   Michele Mcalpine MD on 08/02/2010   Method used:  Print then Give to Patient   RxID:   [id number]    Immunization History:  Influenza Immunization History:    Influenza:  historical (03/28/2010)  Immunizations Administered:  Tetanus Vaccine:    Vaccine Type: Tdap    Site: right deltoid    Mfr: GlaxoSmithKline    Dose: 0.5 ml    Route: IM    Given by: [name] CMA    Exp. Date: 03/31/2012    Lot #: [id number]    VIS given: 04/29/08 version given August 02, 2010.

## 2010-08-26 ENCOUNTER — Telehealth: Payer: Self-pay | Admitting: Pulmonary Disease

## 2010-08-30 ENCOUNTER — Telehealth: Payer: Self-pay | Admitting: Pulmonary Disease

## 2010-08-30 NOTE — Telephone Encounter (Signed)
LMOMTCB

## 2010-08-31 NOTE — Telephone Encounter (Signed)
Pt called back and states she was told she was slightly anemic and was told to take iron everyday. Pt wants to know for how long she needs to take the iron. Pt also states could she be anemic due to possible her having an stomach ulcer bc she has some many stomach issues for the past year or could it be her menstrual flow is more heavy than normally. Pt also states if she gets constipated what type of stool softener does Dr. Kriste Basque recommend?. Pt is coming in on Thursday to pick up her stool cards. Please advise Dr. Kriste Basque. Thanks KNDA Carver Fila, MA

## 2010-08-31 NOTE — Telephone Encounter (Signed)
lmomtcb for pt 

## 2010-09-01 NOTE — Telephone Encounter (Signed)
Pt returning call can be reached at [phone].

## 2010-09-01 NOTE — Telephone Encounter (Signed)
Called and spoke with pt again about her lab results---she is aware that any stool softner can be used otc---colace, senokot, etc..the patient is also aware that the iron tablets will be taken daily until her next ov with SN--and he wants her to do the stool cards to check for any hidden blood in her stools to make sure that no other reasons other than her heavy cycle monthly can be causing this problem.  Pt voiced her understanding of this again.

## 2010-09-08 NOTE — Progress Notes (Signed)
Summary: waiting on call back  Phone Note Outgoing Call   Summary of Call: called and lmomtcb for pt---per SN----on diet alone---not quite as good as last year for her chol----recs for low chol/low fat diet or consider low dose statin rx----chems, hepat ok----mild anemia 10.9---was 12-12.5 last few years and SN suspects this is coming from her menses--recs check stool card for hidden blood but check this after her cycle---start on otc feosol 325mg   daily and stay on this--thyroid is ok on her synthroid 88 daily so keep the same----waiting on pt to return my call [name] CMA  August 26, 2010 5:17 PM   Follow-up for Phone Call        patient retuned [facility] call she can be reached at [phone].Vedia Coffer  August 29, 2010 4:07 PM  Additional Follow-up for Phone Call Additional follow up Details #1::        called and spoke with pt about her lab results per SN---pt is aware to start on the iron tabets 325mg  daily---she will stop by to pick up the stool cards--- and pt is aware that thyroid is ok on the synthroid 88 daily---pt will call for any other concerns [name] [facility]  August 29, 2010 4:41 PM

## 2010-10-05 ENCOUNTER — Other Ambulatory Visit: Payer: BC Managed Care – PPO

## 2010-10-05 ENCOUNTER — Other Ambulatory Visit: Payer: Self-pay | Admitting: Pulmonary Disease

## 2010-10-05 DIAGNOSIS — Z0000 Encounter for general adult medical examination without abnormal findings: Secondary | ICD-10-CM

## 2010-10-05 LAB — HEMOCCULT SLIDES (X 3 CARDS)
Fecal Occult Blood: NEGATIVE
OCCULT 1: NEGATIVE
OCCULT 2: NEGATIVE
OCCULT 3: NEGATIVE
OCCULT 4: NEGATIVE
OCCULT 5: NEGATIVE

## 2011-08-04 ENCOUNTER — Encounter: Payer: Self-pay | Admitting: Pulmonary Disease

## 2011-08-04 ENCOUNTER — Ambulatory Visit (INDEPENDENT_AMBULATORY_CARE_PROVIDER_SITE_OTHER): Payer: BC Managed Care – PPO | Admitting: Pulmonary Disease

## 2011-08-04 VITALS — BP 142/76 | HR 86 | Temp 97.0°F | Ht 65.0 in | Wt 130.6 lb

## 2011-08-04 DIAGNOSIS — J309 Allergic rhinitis, unspecified: Secondary | ICD-10-CM

## 2011-08-04 DIAGNOSIS — Z0000 Encounter for general adult medical examination without abnormal findings: Secondary | ICD-10-CM

## 2011-08-04 DIAGNOSIS — E785 Hyperlipidemia, unspecified: Secondary | ICD-10-CM

## 2011-08-04 DIAGNOSIS — J45909 Unspecified asthma, uncomplicated: Secondary | ICD-10-CM

## 2011-08-04 DIAGNOSIS — D649 Anemia, unspecified: Secondary | ICD-10-CM

## 2011-08-04 DIAGNOSIS — E039 Hypothyroidism, unspecified: Secondary | ICD-10-CM

## 2011-08-04 MED ORDER — LEVOTHYROXINE SODIUM 88 MCG PO TABS: 88.0000 ug | ORAL_TABLET | Freq: Every day | ORAL | Status: DC

## 2011-08-04 NOTE — Patient Instructions (Signed)
Today we updated your med list in our EPIC system...    Continue your current medications the same...    We refilled your Synthroid per request...  Please return one morning next week for your follow up fasting blood work...    Please call the PHONE TREE in a few days for your results...    Dial [id number] & when prompted enter your patient number followed by the # symbol...    Your patient number is:  [id number]  Call for any questions...  Let's plan another check up in one years time.Marland KitchenMarland Kitchen

## 2011-08-04 NOTE — Progress Notes (Signed)
Subjective:     Patient ID: Kim [name], female   DOB: 16-Dec-1965, 46 y.o.   MRN: [id number]  HPI 46 y/o WF here for a routine CPX... she is the daughter of Kim [name] and the step-daughter of Kim [name]...   ~  July 01, 2009:  last seen 1/10 for CPX doing satis on Levoxyl for hypothy... she's had a good yr- no new complaints or concerns... 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 currently doing part time jobs til something comes avail (stressful).  ~  August 02, 2010:  45mo ROV- doing well w/o new complaints or concerns;  requests refill for 90d supplies... she has an essentially neg ROS;  she sees Kim [name] at [place] for GYN each spring & doing satis... she remains on allergy shots per Kim [name] & doing satis, no asthma attacks etc... Chol remains under fair control on diet & exercise;  Thyroid is well regulated on Levothy49mcg/d...  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 Feeling well w/o new complaints or concerns; exercises regularly at gym- palades, zoomba, etc... Stable on Synthroid 49mcg/d & refilled... See prob list below>> she will ret for FASTING blood work...          Problem List:    PHYSICAL EXAMINATION (ICD-V70.0) - her GYN is Kim [name] at [facility] for PAPs, etc (she is on Naprosyn for cramps)... she gets her Mammograms at [facility] and she will have copies forwarded to [place]... IMMUNIZATIONS: she had a tetanus shot in 2001 & gets the yearly Flu vaccines... ~  2/12:  given TDAP in office...  ALLERGIC RHINITIS (ICD-477.9) - allergic to trees, grass, mold, dust, ragweed, & pets- on shots x yrs from Kim [name], and uses Flonase, Astelin, Antihist Prn.  Hx of ASTHMA (ICD-493.90) - hx of asthma as a child, no prob x yrs.  HYPERCHOLESTEROLEMIA, BORDERLINE (ICD-272.4) - on diet alone... ~  FLP 1/10 showed TChol 191,  TG 95, HDL 45, LDL 127... discussed diet Rx. ~  FLP 1/11 showed TChol 197, TG108, HDL 49, LDL 126... We reviewed diet, exercise. ~  FLP 2/12 showed TChol 204, TG 99, HDL 53, LDL 136... She does not want meds. ~  FLP 2/13 on diet alone showed TChol 191, TG 136, HDL 49, LDL 115  HYPOTHYROIDISM (ICD-244.9) - on LEVOXYL 58mcg/d... ~  routine labs 7/07 w/ TSH 10.9 & Synthroid started... ~  labs 1/10 on Levothy88 showed TSH= 2.76... continue same. ~  labs 1/11 on Levothy88 showed TSH= 3.22 ~  labs 2/12 on Levothy88 showed TSH= 2.80 ~  Labs 2/13 on Levothy88 showed TSH= 4.73 & reminded to take every day.  ANEMIA >> rec to take Iron supplement... ~  Labs 1/11 showed Hg= 12.3, MCV= 82, stools neg for occult blood. ~  Labs 2/12 showed Hg= 10.9, MCV= 76, stools neg for occult blood. ~  Labs 2/13 showed Hg= 14.0, MCV= 87   No past surgical history on file.   Outpatient Encounter Prescriptions as of 08/04/2011  Medication Sig Dispense Refill  . levothyroxine (SYNTHROID, LEVOTHROID) 88 MCG tablet Take 88 mcg by mouth daily.      . naproxen (NAPROSYN) 500 MG tablet As needed for monthly cramps        No Known Allergies   Current Medications, Allergies, Past Medical History, Past Surgical History, Family History, and Social  History were reviewed in [facility] electronic medical record.   Review of Systems    The patient denies fever, chills, sweats, anorexia, fatigue, weakness, malaise, weight loss, sleep disorder, blurring, diplopia, eye irritation, eye discharge, vision loss, eye pain, photophobia, earache, ear discharge, tinnitus, decreased hearing, nasal congestion, nosebleeds, sore throat, hoarseness, chest pain, palpitations, syncope, dyspnea on exertion, orthopnea, PND, peripheral edema, cough, dyspnea at rest, excessive sputum, hemoptysis, wheezing, pleurisy, nausea, vomiting, diarrhea, constipation, change in bowel habits, abdominal pain, melena, hematochezia, jaundice, gas/bloating,  indigestion/heartburn, dysphagia, odynophagia, dysuria, hematuria, urinary frequency, urinary hesitancy, nocturia, incontinence, back pain, joint pain, joint swelling, muscle cramps, muscle weakness, stiffness, arthritis, sciatica, restless legs, leg pain at night, leg pain with exertion, rash, itching, dryness, suspicious lesions, paralysis, paresthesias, seizures, tremors, vertigo, transient blindness, frequent falls, frequent headaches, difficulty walking, depression, anxiety, memory loss, confusion, cold intolerance, heat intolerance, polydipsia, polyphagia, polyuria, unusual weight change, abnormal bruising, bleeding, enlarged lymph nodes, urticaria, allergic rash, hay fever, and recurrent infections.     Objective:   Physical Exam     WD, WN, 46 y/o WF in NAD... GENERAL:  Alert & oriented; pleasant & cooperative. HEENT:  /AT, EOM-wnl, PERRLA, Fundi-benign, EACs-clear, TMs-wnl, NOSE-clear, THROAT-clear & wnl. NECK:  Supple w/ full ROM; no JVD; normal carotid impulses w/o bruits; no thyromegaly or nodules palpated; no lymphadenopathy. CHEST:  Clear to P & A; without wheezes/ rales/ or rhonchi. HEART:  Regular Rhythm; without murmurs/ rubs/ or gallops. ABDOMEN:  Soft & nontender; normal bowel sounds; no organomegaly or masses detected. EXT: without deformities or arthritic changes; no varicose veins/ venous insuffic/ or edema. NEURO:  CN's intact; motor testing normal; sensory testing normal; gait normal & balance OK. DERM:  No lesions noted; no rash etc...  RADIOLOGY DATA:  Reviewed in the EPIC EMR & discussed w/ the patient...    >>CXR 2/12 showed normal heart size, clear lungs, NAD (no change from 1/10)...    >>EKG 2/12 showed NSR, rate74, WNL/ NAD (no change from 1/11)...  LABORATORY DATA:  Reviewed in the EPIC EMR & discussed w/ the patient...    >>LABS 2/13:  FLP- ok x LDL 115 on diet alone;  CBC- ok;  Chems- ok;  TSH=4.73 on Synthroid88 7 reminded to take every day.   Assessment:      Physical Exam>>  AR/ ASTHMA>  On shots from Kim [name] every other week & doing well, no complaints or concerns...  HYPERCHOLESTEROLEMIA>  Her parameters have not been at goal but she declines meds and prefers to continue diet 7 exercise efforts...  HYPOTHYROID>  Stable on synthroid 43mcg/d, clinically stable, continue same...  ANEMIA>  Hx borderline anemia likely related to menorrhagia; needs better iron intake...     Plan:     Patient's Medications  New Prescriptions   No medications on file  Previous Medications   NAPROXEN (NAPROSYN) 500 MG TABLET    As needed for monthly cramps  Modified Medications   Modified Medication Previous Medication   LEVOTHYROXINE (SYNTHROID, LEVOTHROID) 88 MCG TABLET levothyroxine (SYNTHROID, LEVOTHROID) 88 MCG tablet      Take 1 tablet (88 mcg total) by mouth daily.    Take 88 mcg by mouth daily.  Discontinued Medications   No medications on file

## 2011-08-10 ENCOUNTER — Other Ambulatory Visit (INDEPENDENT_AMBULATORY_CARE_PROVIDER_SITE_OTHER): Payer: BC Managed Care – PPO

## 2011-08-10 DIAGNOSIS — E785 Hyperlipidemia, unspecified: Secondary | ICD-10-CM

## 2011-08-10 DIAGNOSIS — Z0000 Encounter for general adult medical examination without abnormal findings: Secondary | ICD-10-CM

## 2011-08-10 DIAGNOSIS — E039 Hypothyroidism, unspecified: Secondary | ICD-10-CM

## 2011-08-10 LAB — URINALYSIS
Bilirubin Urine: NEGATIVE
Ketones, ur: NEGATIVE
Leukocytes, UA: NEGATIVE
Nitrite: NEGATIVE
Specific Gravity, Urine: 1.01 (ref 1.000–1.030)
Total Protein, Urine: NEGATIVE
Urine Glucose: NEGATIVE
Urobilinogen, UA: 0.2 (ref 0.0–1.0)
pH: 7 (ref 5.0–8.0)

## 2011-08-10 LAB — LIPID PANEL
Cholesterol: 191 mg/dL (ref 0–200)
HDL: 48.5 mg/dL (ref >39.00)
LDL Cholesterol: 115 mg/dL — ABNORMAL HIGH (ref 0–99)
Total CHOL/HDL Ratio: 4
Triglycerides: 136 mg/dL (ref 0.0–149.0)
VLDL: 27.2 mg/dL (ref 0.0–40.0)

## 2011-08-10 LAB — CBC WITH DIFFERENTIAL/PLATELET
Basophils Absolute: 0.1 10*3/uL (ref 0.0–0.1)
Basophils Relative: 1.2 % (ref 0.0–3.0)
Eosinophils Absolute: 0.5 10*3/uL (ref 0.0–0.7)
Eosinophils Relative: 8.6 % — ABNORMAL HIGH (ref 0.0–5.0)
HCT: 41.4 % (ref 36.0–46.0)
Hemoglobin: 14 g/dL (ref 12.0–15.0)
Lymphocytes Relative: 28.9 % (ref 12.0–46.0)
Lymphs Abs: 1.6 10*3/uL (ref 0.7–4.0)
MCHC: 33.8 g/dL (ref 30.0–36.0)
MCV: 87.3 fl (ref 78.0–100.0)
Monocytes Absolute: 0.4 10*3/uL (ref 0.1–1.0)
Monocytes Relative: 6.5 % (ref 3.0–12.0)
Neutro Abs: 3.1 10*3/uL (ref 1.4–7.7)
Neutrophils Relative %: 54.8 % (ref 43.0–77.0)
Platelets: 233 10*3/uL (ref 150.0–400.0)
RBC: 4.75 Mil/uL (ref 3.87–5.11)
RDW: 13.8 % (ref 11.5–14.6)
WBC: 5.6 10*3/uL (ref 4.5–10.5)

## 2011-08-10 LAB — BASIC METABOLIC PANEL
BUN: 11 mg/dL (ref 6–23)
CO2: 29 mEq/L (ref 19–32)
Calcium: 9 mg/dL (ref 8.4–10.5)
Chloride: 97 mEq/L (ref 96–112)
Creatinine, Ser: 0.6 mg/dL (ref 0.4–1.2)
GFR: 114.9 mL/min (ref >60.00)
Glucose, Bld: 81 mg/dL (ref 70–99)
Potassium: 3.6 mEq/L (ref 3.5–5.1)
Sodium: 136 mEq/L (ref 135–145)

## 2011-08-10 LAB — HEPATIC FUNCTION PANEL
ALT: 13 U/L (ref 0–35)
AST: 24 U/L (ref 0–37)
Albumin: 4.5 g/dL (ref 3.5–5.2)
Alkaline Phosphatase: 50 U/L (ref 39–117)
Bilirubin, Direct: 0.1 mg/dL (ref 0.0–0.3)
Total Bilirubin: 0.5 mg/dL (ref 0.3–1.2)
Total Protein: 7.6 g/dL (ref 6.0–8.3)

## 2011-08-10 LAB — TSH: TSH: 4.73 u[IU]/mL (ref 0.35–5.50)

## 2011-09-20 ENCOUNTER — Other Ambulatory Visit: Payer: BC Managed Care – PPO

## 2011-09-20 LAB — HEMOCCULT SLIDES (X 3 CARDS)
Fecal Occult Blood: NEGATIVE
OCCULT 1: NEGATIVE
OCCULT 2: NEGATIVE
OCCULT 3: NEGATIVE
OCCULT 4: NEGATIVE
OCCULT 5: NEGATIVE

## 2011-09-21 ENCOUNTER — Telehealth: Payer: Self-pay | Admitting: Pulmonary Disease

## 2011-09-21 NOTE — Telephone Encounter (Signed)
Called and spoke with pt and she is aware of negative stool cards and her lab results since she never got these from the phone tree.

## 2012-08-05 ENCOUNTER — Encounter: Payer: Self-pay | Admitting: Pulmonary Disease

## 2012-08-05 ENCOUNTER — Ambulatory Visit (INDEPENDENT_AMBULATORY_CARE_PROVIDER_SITE_OTHER)
Admission: RE | Admit: 2012-08-05 | Discharge: 2012-08-05 | Disposition: A | Discharge status: Home / Self Care | Payer: BC Managed Care – PPO | Source: Ambulatory Visit | Attending: Pulmonary Disease | Admitting: Pulmonary Disease

## 2012-08-05 ENCOUNTER — Ambulatory Visit (INDEPENDENT_AMBULATORY_CARE_PROVIDER_SITE_OTHER): Payer: BC Managed Care – PPO | Admitting: Pulmonary Disease

## 2012-08-05 VITALS — BP 136/64 | HR 77 | Temp 96.6°F | Ht 65.0 in | Wt 131.0 lb

## 2012-08-05 DIAGNOSIS — Z0000 Encounter for general adult medical examination without abnormal findings: Secondary | ICD-10-CM

## 2012-08-05 DIAGNOSIS — E785 Hyperlipidemia, unspecified: Secondary | ICD-10-CM

## 2012-08-05 DIAGNOSIS — J45909 Unspecified asthma, uncomplicated: Secondary | ICD-10-CM

## 2012-08-05 DIAGNOSIS — E039 Hypothyroidism, unspecified: Secondary | ICD-10-CM

## 2012-08-05 DIAGNOSIS — J309 Allergic rhinitis, unspecified: Secondary | ICD-10-CM

## 2012-08-05 MED ORDER — LEVOTHYROXINE SODIUM 88 MCG PO TABS: 88.0000 ug | ORAL_TABLET | Freq: Every day | ORAL | Status: DC

## 2012-08-05 NOTE — Patient Instructions (Addendum)
Today we updated your med list in our EPIC system...    Continue your current medications the same...    We refilled your meds as requested...  Today we did your follow up CXR & EKG... Please return to our lab one morning this week for your FASTING blood work...    We will contact you w/ the results when avail...  Call for any problems...  Let's continue our yearly physical & check up.Marland KitchenMarland Kitchen

## 2012-08-05 NOTE — Progress Notes (Signed)
Subjective:     Patient ID: Kim [name], female   DOB: Oct 25, 1965, 47 y.o.   MRN: [id number]  HPI 47 y/o WF here for a routine CPX... she is the daughter of Di Kindle and the step-daughter of Deeanne Deininger...   ~  July 01, 2009:  last seen 1/10 for CPX doing satis on Levoxyl for hypothy... she's had a good yr- no new complaints or concerns... 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 currently doing part time jobs til something comes avail (stressful).  ~  August 02, 2010:  31mo ROV- doing well w/o new complaints or concerns;  requests refill for 90d supplies... she has an essentially neg ROS;  she sees DrCSmith at [place] for GYN each spring & doing satis... she remains on allergy shots per DrESL & doing satis, no asthma attacks etc... Chol remains under fair control on diet & exercise;  Thyroid is well regulated on Levothy10mcg/d...  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 Feeling well w/o new complaints or concerns; exercises regularly at gym- palades, zoomba, etc... Stable on Synthroid 56mcg/d & refilled... See prob list below>> she will ret for FASTING blood work...  ~  August 05, 2012:  Yearly ROV & CPX> she reports a good yr- feeling well w/o new complaints or concerns...    AR/Asthma> she denies asthma exac; no regular meds; denies cough, sput, hemoptysis, SOB, etc...    Chol> on diet alone; FLP shows TChol 197, TG 117, HDL 47, LDL 126; she does not want meds, therefore better diet...    Hypothy> on Synthroid88; labs showed TSH= 4.63    GYN> on Naprosyn500; GYN= DrCSmith & due for Pap, Mammogram, etc...    HxAnemia> Hg is now back to 14.4 We reviewed prob list, meds, xrays and labs> see below for updates >>  CXR 2/14 showed normal heart size, clear lungs, NAD.Marland KitchenMarland Kitchen EKG 2/14 showed NSR, rate71, WNL, NAD... LABS  2/14:  FLP- ok x LDL=126;  Chems- wnl;  CBC- wnl;  TSH=4.63, UA- ok           Problem List:    PHYSICAL EXAMINATION (ICD-V70.0) - her GYN is Dr[facility]mith at [facility] for PAPs, etc (she is on Naprosyn for cramps)... she gets her Mammograms at [facility] and she will have copies forwarded to [place]... IMMUNIZATIONS: she had a tetanus shot in 2001 & gets the yearly Flu vaccines... ~  2/12:  given TDAP in office...  ALLERGIC RHINITIS (ICD-477.9) - allergic to trees, grass, mold, dust, ragweed, & pets- on shots x yrs from DrESL, and uses Flonase, Astelin, Antihist Prn.  Hx of ASTHMA (ICD-493.90) - hx of asthma as a child, no prob x yrs. ~  CXR 2/11 showed normal heart size, clear lungs, NAD.Marland Kitchen. ~  CXR 2/14 showed normal heart size, clear lungs, NAD...  HYPERCHOLESTEROLEMIA, BORDERLINE (ICD-272.4) - on diet alone... ~  FLP 1/10 showed TChol 191, TG 95, HDL 45, LDL 127... discussed diet Rx. ~  FLP 1/11 showed TChol 197, TG108, HDL 49, LDL 126... We reviewed diet, exercise. ~  FLP 2/12 showed TChol 204, TG 99, HDL 53, LDL 136... She does not want meds. ~  FLP 2/13 on diet alone showed TChol 191, TG 136, HDL 49, LDL 115 ~  FLP 2/14 on diet alone showed TChol 197, TG 117, HDL 47,  LDL 126   HYPOTHYROIDISM (ZOX-096.0) - on LEVOXYL 36mcg/d... ~  routine labs 7/07 w/ TSH 10.9 & Synthroid started... ~  labs 1/10 on Levothy88 showed TSH= 2.76... continue same. ~  labs 1/11 on Levothy88 showed TSH= 3.22 ~  labs 2/12 on Levothy88 showed TSH= 2.80 ~  Labs 2/13 on Levothy88 showed TSH= 4.73 & reminded to take every day. ~  Labs 2/14 on Levothy88 showed TSH= 4.63  ANEMIA >> rec to take Iron supplement... ~  Labs 1/11 showed Hg= 12.3, MCV= 82, stools neg for occult blood. ~  Labs 2/12 showed Hg= 10.9, MCV= 76, stools neg for occult blood. ~  Labs 2/13 showed Hg= 14.0, MCV= 87 ~  Labs 2/14 showed Hg= 14.4   History reviewed. No pertinent past surgical history.   Outpatient Encounter Prescriptions as of  08/05/2012  Medication Sig Dispense Refill  . levothyroxine (SYNTHROID, LEVOTHROID) 88 MCG tablet Take 1 tablet (88 mcg total) by mouth daily.  90 tablet  3  . naproxen (NAPROSYN) 500 MG tablet As needed for monthly cramps       No facility-administered encounter medications on file as of 08/05/2012.    No Known Allergies   Current Medications, Allergies, Past Medical History, Past Surgical History, Family History, and Social History were reviewed in [employer] record.   Review of Systems    The patient denies fever, chills, sweats, anorexia, fatigue, weakness, malaise, weight loss, sleep disorder, blurring, diplopia, eye irritation, eye discharge, vision loss, eye pain, photophobia, earache, ear discharge, tinnitus, decreased hearing, nasal congestion, nosebleeds, sore throat, hoarseness, chest pain, palpitations, syncope, dyspnea on exertion, orthopnea, PND, peripheral edema, cough, dyspnea at rest, excessive sputum, hemoptysis, wheezing, pleurisy, nausea, vomiting, diarrhea, constipation, change in bowel habits, abdominal pain, melena, hematochezia, jaundice, gas/bloating, indigestion/heartburn, dysphagia, odynophagia, dysuria, hematuria, urinary frequency, urinary hesitancy, nocturia, incontinence, back pain, joint pain, joint swelling, muscle cramps, muscle weakness, stiffness, arthritis, sciatica, restless legs, leg pain at night, leg pain with exertion, rash, itching, dryness, suspicious lesions, paralysis, paresthesias, seizures, tremors, vertigo, transient blindness, frequent falls, frequent headaches, difficulty walking, depression, anxiety, memory loss, confusion, cold intolerance, heat intolerance, polydipsia, polyphagia, polyuria, unusual weight change, abnormal bruising, bleeding, enlarged lymph nodes, urticaria, allergic rash, hay fever, and recurrent infections.     Objective:   Physical Exam     WD, WN, 47 y/o WF in NAD... GENERAL:  Alert & oriented;  pleasant & cooperative. HEENT:  [place]/AT, EOM-wnl, PERRLA, Fundi-benign, EACs-clear, TMs-wnl, NOSE-clear, THROAT-clear & wnl. NECK:  Supple w/ full ROM; no JVD; normal carotid impulses w/o bruits; no thyromegaly or nodules palpated; no lymphadenopathy. CHEST:  Clear to P & A; without wheezes/ rales/ or rhonchi. HEART:  Regular Rhythm; without murmurs/ rubs/ or gallops. ABDOMEN:  Soft & nontender; normal bowel sounds; no organomegaly or masses detected. EXT: without deformities or arthritic changes; no varicose veins/ venous insuffic/ or edema. NEURO:  CN's intact; motor testing normal; sensory testing normal; gait normal & balance OK. DERM:  No lesions noted; no rash etc...  RADIOLOGY DATA:  Reviewed in the EPIC EMR & discussed w/ the patient...    LABORATORY DATA:  Reviewed in the EPIC EMR & discussed w/ the patient...     Assessment:     Physical Exam>>  AR/ ASTHMA>  On shots from drESL every other week & doing well, no complaints or concerns...  HYPERCHOLESTEROLEMIA>  Her parameters have not been at goal but she declines meds and prefers to continue diet 7 exercise efforts.Marland KitchenMarland Kitchen  HYPOTHYROID>  Stable on synthroid 73mcg/d, clinically stable, continue same...  ANEMIA>  Hx borderline anemia likely related to menorrhagia; needs better iron intake...     Plan:     Patient's Medications  New Prescriptions   No medications on file  Previous Medications   NAPROXEN (NAPROSYN) 500 MG TABLET    As needed for monthly cramps  Modified Medications   Modified Medication Previous Medication   LEVOTHYROXINE (SYNTHROID, LEVOTHROID) 88 MCG TABLET levothyroxine (SYNTHROID, LEVOTHROID) 88 MCG tablet      Take 1 tablet (88 mcg total) by mouth daily.    Take 1 tablet (88 mcg total) by mouth daily.  Discontinued Medications   No medications on file

## 2012-08-19 ENCOUNTER — Telehealth: Payer: Self-pay | Admitting: Pulmonary Disease

## 2012-08-19 NOTE — Telephone Encounter (Signed)
Labs are still in EPIC.   lmomtcb x1

## 2012-08-19 NOTE — Telephone Encounter (Signed)
Pt returned call. I advised labs are still in. Nothing further needed per pt. Kim [name]

## 2012-08-22 ENCOUNTER — Other Ambulatory Visit (INDEPENDENT_AMBULATORY_CARE_PROVIDER_SITE_OTHER): Payer: BC Managed Care – PPO

## 2012-08-22 DIAGNOSIS — Z0000 Encounter for general adult medical examination without abnormal findings: Secondary | ICD-10-CM

## 2012-08-22 LAB — CBC WITH DIFFERENTIAL/PLATELET
Basophils Absolute: 0 10*3/uL (ref 0.0–0.1)
Basophils Relative: 0.8 % (ref 0.0–3.0)
Eosinophils Absolute: 0.4 10*3/uL (ref 0.0–0.7)
Eosinophils Relative: 7.6 % — ABNORMAL HIGH (ref 0.0–5.0)
HCT: 42.7 % (ref 36.0–46.0)
Hemoglobin: 14.4 g/dL (ref 12.0–15.0)
Lymphocytes Relative: 29.2 % (ref 12.0–46.0)
Lymphs Abs: 1.7 10*3/uL (ref 0.7–4.0)
MCHC: 33.7 g/dL (ref 30.0–36.0)
MCV: 84.6 fl (ref 78.0–100.0)
Monocytes Absolute: 0.5 10*3/uL (ref 0.1–1.0)
Monocytes Relative: 8.4 % (ref 3.0–12.0)
Neutro Abs: 3.1 10*3/uL (ref 1.4–7.7)
Neutrophils Relative %: 54 % (ref 43.0–77.0)
Platelets: 239 10*3/uL (ref 150.0–400.0)
RBC: 5.05 Mil/uL (ref 3.87–5.11)
RDW: 13.8 % (ref 11.5–14.6)
WBC: 5.8 10*3/uL (ref 4.5–10.5)

## 2012-08-22 LAB — HEPATIC FUNCTION PANEL
ALT: 13 U/L (ref 0–35)
AST: 22 U/L (ref 0–37)
Albumin: 4.3 g/dL (ref 3.5–5.2)
Alkaline Phosphatase: 47 U/L (ref 39–117)
Bilirubin, Direct: 0.2 mg/dL (ref 0.0–0.3)
Total Bilirubin: 0.9 mg/dL (ref 0.3–1.2)
Total Protein: 7.7 g/dL (ref 6.0–8.3)

## 2012-08-22 LAB — URINALYSIS, ROUTINE W REFLEX MICROSCOPIC
Bilirubin Urine: NEGATIVE
Ketones, ur: NEGATIVE
Leukocytes, UA: NEGATIVE
Nitrite: NEGATIVE
Specific Gravity, Urine: 1.01 (ref 1.000–1.030)
Total Protein, Urine: NEGATIVE
Urine Glucose: NEGATIVE
Urobilinogen, UA: 0.2 (ref 0.0–1.0)
pH: 6 (ref 5.0–8.0)

## 2012-08-22 LAB — BASIC METABOLIC PANEL
BUN: 14 mg/dL (ref 6–23)
CO2: 29 mEq/L (ref 19–32)
Calcium: 9.2 mg/dL (ref 8.4–10.5)
Chloride: 100 mEq/L (ref 96–112)
Creatinine, Ser: 0.7 mg/dL (ref 0.4–1.2)
GFR: 89.79 mL/min (ref >60.00)
Glucose, Bld: 95 mg/dL (ref 70–99)
Potassium: 4.4 mEq/L (ref 3.5–5.1)
Sodium: 135 mEq/L (ref 135–145)

## 2012-08-22 LAB — LIPID PANEL
Cholesterol: 197 mg/dL (ref 0–200)
HDL: 47.3 mg/dL (ref >39.00)
LDL Cholesterol: 126 mg/dL — ABNORMAL HIGH (ref 0–99)
Total CHOL/HDL Ratio: 4
Triglycerides: 117 mg/dL (ref 0.0–149.0)
VLDL: 23.4 mg/dL (ref 0.0–40.0)

## 2012-08-22 LAB — TSH: TSH: 4.63 u[IU]/mL (ref 0.35–5.50)

## 2012-08-23 ENCOUNTER — Telehealth: Payer: Self-pay | Admitting: Pulmonary Disease

## 2012-08-23 NOTE — Telephone Encounter (Signed)
Notes Recorded by Michele Mcalpine, MD on 08/22/2012 at 4:05 PM Please notify patient>  FLP is ok but LDL=126, not at goal of <100 & rec better low chol/ low fat diet/ incr exercise... ChemsCBC, Thyroid, UA> all WNL, clear...   I spoke with patient about results and she verbalized understanding and had no questions

## 2012-11-22 ENCOUNTER — Telehealth: Payer: Self-pay | Admitting: Pulmonary Disease

## 2012-11-22 NOTE — Telephone Encounter (Signed)
lmomtcb  

## 2012-11-22 NOTE — Telephone Encounter (Signed)
Called and spoke with pt and she stated that the pharmacy sent [place] a fax over asking to change the manufactuer .  i will look for this paper from the pharmacy and if i cant find it pt is aware i will call the pharmacy to get this refaxed.  i called the pharmacy and they are aware that this is ok to change to the new manufacturer that they are now using.  Nothing further is needed.

## 2012-11-22 NOTE — Telephone Encounter (Signed)
Pt returned call. Kim [name]  

## 2013-05-23 ENCOUNTER — Telehealth: Payer: Self-pay | Admitting: Pulmonary Disease

## 2013-05-23 NOTE — Telephone Encounter (Signed)
Pt returned triage's call.  Holly D Pryor ° °

## 2013-05-23 NOTE — Telephone Encounter (Signed)
LMTC x 1  

## 2013-05-23 NOTE — Telephone Encounter (Signed)
[employer] and spoke with pharm tech Hamler.  Per Shanda Bumps, pt's current levothyroxine is thru the manufacturer [employer], costing pt $37.64 for 90day supply.  Per Shanda Bumps, a fax was sent to our office previously (she could not provide a date) asking to change pt to the cheaper manufacturer [employer] but they received a denial to do this.  Per pt's chart, the 6.13.14 phone note shows that Leigh called the pharmacy and okayed to change pt to the cheaper manufacturer.  Gave verbal order to Shanda Bumps to do this and she verbalized her understanding.  Called spoke with patient and discussed the above with her.  Pt stated that she remembered this being taken care of previously as well.  Pt verbalized her understanding to call the office again should any more confusion arise regarding her medications.  Nothing further needed; will sign off.

## 2013-08-19 ENCOUNTER — Ambulatory Visit (INDEPENDENT_AMBULATORY_CARE_PROVIDER_SITE_OTHER): Payer: BC Managed Care – PPO | Admitting: Pulmonary Disease

## 2013-08-19 ENCOUNTER — Encounter: Payer: Self-pay | Admitting: Pulmonary Disease

## 2013-08-19 VITALS — BP 134/78 | HR 75 | Temp 97.3°F | Ht 65.0 in | Wt 132.0 lb

## 2013-08-19 DIAGNOSIS — E785 Hyperlipidemia, unspecified: Secondary | ICD-10-CM

## 2013-08-19 DIAGNOSIS — J309 Allergic rhinitis, unspecified: Secondary | ICD-10-CM

## 2013-08-19 DIAGNOSIS — Z0000 Encounter for general adult medical examination without abnormal findings: Secondary | ICD-10-CM | POA: Insufficient documentation

## 2013-08-19 DIAGNOSIS — J45909 Unspecified asthma, uncomplicated: Secondary | ICD-10-CM

## 2013-08-19 DIAGNOSIS — Z862 Personal history of diseases of the blood and blood-forming organs and certain disorders involving the immune mechanism: Secondary | ICD-10-CM | POA: Insufficient documentation

## 2013-08-19 DIAGNOSIS — E039 Hypothyroidism, unspecified: Secondary | ICD-10-CM

## 2013-08-19 MED ORDER — LEVOTHYROXINE SODIUM 88 MCG PO TABS: 88.0000 ug | ORAL_TABLET | Freq: Every day | ORAL | Status: DC

## 2013-08-19 NOTE — Patient Instructions (Signed)
Today we updated your med list in our EPIC system...    Continue your current medications the same...  Please return to our lab one morning at your convenience for your FASTING blood work...    We will contact you w/ the results when available...   For [facility]    We discussed treatment w/ a "white noise" machine, background music etc...    There are numerous OTC, health- food store supplements that are available to treat this problem...    Let me know if the symptom worsens for an ENT referral  Call for any questions.Marland KitchenMarland Kitchen

## 2013-08-19 NOTE — Progress Notes (Signed)
Subjective:     Patient ID: Kim [name], female   DOB: 1965/12/02, 48 y.o.   MRN: [id number]  HPI 48 y/o WF here for a routine CPX... she is the daughter of Asencion Partridge and the step-daughter of Paije Goodhart...   ~  July 01, 2009:  last seen 1/10 for CPX doing satis on Levoxyl for hypothy... she's had a good yr- no new complaints or concerns... 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 currently doing part time jobs til something comes avail (stressful).  ~  August 02, 2010:  39mo ROV- doing well w/o new complaints or concerns;  requests refill for 90d supplies... she has an essentially neg ROS;  she sees DrCSmith at [facility] for GYN each [name] & doing satis... she remains on allergy shots per DrESL & doing satis, no asthma attacks etc... Chol remains under fair control on diet & exercise;  Thyroid is well regulated on Levothy69mcg/d...  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 Feeling well w/o new complaints or concerns; exercises regularly at gym- palades, zoomba, etc... Stable on Synthroid 79mcg/d & refilled... See prob list below>> she will ret for FASTING blood work...  ~  August 05, 2012:  Yearly ROV & CPX> she reports a good yr- feeling well w/o new complaints or concerns...    AR/Asthma> she denies asthma exac; no regular meds; denies cough, sput, hemoptysis, SOB, etc...    Chol> on diet alone; FLP shows TChol 197, TG 117, HDL 47, LDL 126; she does not want meds, therefore better diet...    Hypothy> on Synthroid88; labs showed TSH= 4.63    GYN> on Naprosyn500; GYN= DrCSmith & due for Pap, Mammogram, etc...    HxAnemia> Hg is now back to 14.4 We reviewed prob list, meds, xrays and labs> see below for updates >>   CXR 2/14 showed normal heart size, clear lungs, NAD.Marland KitchenMarland Kitchen  EKG 2/14 showed NSR, rate71, WNL,  NAD...  LABS 2/14:  FLP- ok x LDL=126;  Chems- wnl;  CBC- wnl;  TSH=4.63, UA- ok   ~  August 19, 2013:  24yr ROV & CPX> Yobana reports a good yr overall her only complaint is some tinnitus which we discussed & I have rec a background "white noise" machine to help at nights... We reviewed the following medical problems during today's office visit >>     AR/Asthma> she denies asthma exac; no regular meds; denies cough, sput, hemoptysis, SOB, etc; she continues on allergy shots from [facility]...    Chol> on diet alone; FLP 3/15 shows TChol 196, TG 120, HDL 45, LDL 127; she does not want meds, therefore better low chol/ low fat diet...    Hypothy> on Synthroid88; labs 3/15 showed TSH= 5.21 and she is reminded to take med everyday...    GYN> on Naprosyn500 prn; GYN= DrCSmith & due for Pap, Mammogram, etc...    HxAnemia> Hg is now back to normal & measured 13.7 Mar2015... We reviewed prob list, meds, xrays and labs> see below for updates >> Meds refilled per request...   LABS 3/15:  FLP- not at goal w/ LDL=127 on diet alone;  Chems- wnl;  CBC- wnl;  TSH=5.21 on Synthroid88;  UA- essentially neg...             Problem List:    PHYSICAL EXAMINATION (ICD-V70.0) -  her GYN is DrCSmith at [facility] for PAPs, etc (she is on Naprosyn for cramps)... she gets her Mammograms at [facility] and she will have copies forwarded to [place]... IMMUNIZATIONS: she had a tetanus shot in 2001 & gets the yearly Flu vaccines... ~  2/12:  given TDAP in office...  ALLERGIC RHINITIS (ICD-477.9) - allergic to trees, grass, mold, dust, ragweed, & pets- on shots x yrs from DrESL, and uses Flonase, Astelin, Antihist Prn. ~  3/15: she noted some tinnitus & we discussed "white noise" machine & masking therapy; she will call if she wants ENT eval...  Hx of ASTHMA (ICD-493.90) - hx of asthma as a child, no prob x yrs. ~  CXR 2/11 showed normal heart size, clear lungs, NAD.Marland Kitchen. ~  CXR 2/14 showed normal heart size, clear lungs,  NAD...  HYPERCHOLESTEROLEMIA, BORDERLINE (ICD-272.4) - on diet alone... ~  FLP 1/10 showed TChol 191, TG 95, HDL 45, LDL 127... discussed diet Rx. ~  FLP 1/11 showed TChol 197, TG108, HDL 49, LDL 126... We reviewed diet, exercise. ~  FLP 2/12 showed TChol 204, TG 99, HDL 53, LDL 136... She does not want meds. ~  [place] 2/13 on diet alone showed TChol 191, TG 136, HDL 49, LDL 115 ~  FLP 2/14 on diet alone showed TChol 197, TG 117, HDL 47, LDL 126  ~  FLP 3/15 on diet alone showed  TChol 196, TG 120, HDL 45, LDL 127  HYPOTHYROIDISM (ICD-244.9) - on LEVOXYL 82mcg/d... ~  routine labs 7/07 w/ TSH 10.9 & Synthroid started... ~  labs 1/10 on Levothy88 showed TSH= 2.76... continue same. ~  labs 1/11 on Levothy88 showed TSH= 3.22 ~  labs 2/12 on Levothy88 showed TSH= 2.80 ~  Labs 2/13 on Levothy88 showed TSH= 4.73 & reminded to take every day. ~  Labs 2/14 on Levothy88 showed TSH= 4.63 ~  LABS 3/15 on Levothy88 showed TSH= 5.21  ANEMIA >> rec to take Iron supplement... ~  Labs 1/11 showed Hg= 12.3, MCV= 82, stools neg for occult blood. ~  Labs 2/12 showed Hg= 10.9, MCV= 76, stools neg for occult blood. ~  Labs 2/13 showed Hg= 14.0, MCV= 87 ~  Labs 2/14 showed Hg= 14.4 ~  Labs 3/15 showed Hg= 13.7   History reviewed. No pertinent past surgical history.   Outpatient Encounter Prescriptions as of 08/19/2013  Medication Sig  . levothyroxine (SYNTHROID, LEVOTHROID) 88 MCG tablet Take 1 tablet (88 mcg total) by mouth daily.  . naproxen (NAPROSYN) 500 MG tablet As needed for monthly cramps    No Known Allergies   Current Medications, Allergies, Past Medical History, Past Surgical History, Family History, and Social History were reviewed in [employer] record.   Review of Systems    The patient denies fever, chills, sweats, anorexia, fatigue, weakness, malaise, weight loss, sleep disorder, blurring, diplopia, eye irritation, eye discharge, vision loss, eye pain,  photophobia, earache, ear discharge, tinnitus, decreased hearing, nasal congestion, nosebleeds, sore throat, hoarseness, chest pain, palpitations, syncope, dyspnea on exertion, orthopnea, PND, peripheral edema, cough, dyspnea at rest, excessive sputum, hemoptysis, wheezing, pleurisy, nausea, vomiting, diarrhea, constipation, change in bowel habits, abdominal pain, melena, hematochezia, jaundice, gas/bloating, indigestion/heartburn, dysphagia, odynophagia, dysuria, hematuria, urinary frequency, urinary hesitancy, nocturia, incontinence, back pain, joint pain, joint swelling, muscle cramps, muscle weakness, stiffness, arthritis, sciatica, restless legs, leg pain at night, leg pain with exertion, rash, itching, dryness, suspicious lesions, paralysis, paresthesias, seizures, tremors, vertigo, transient blindness, frequent falls, frequent headaches, difficulty walking, depression,  anxiety, memory loss, confusion, cold intolerance, heat intolerance, polydipsia, polyphagia, polyuria, unusual weight change, abnormal bruising, bleeding, enlarged lymph nodes, urticaria, allergic rash, hay fever, and recurrent infections.     Objective:   Physical Exam     WD, WN, 48 y/o WF in NAD... GENERAL:  Alert & oriented; pleasant & cooperative. HEENT:  [place]/AT, EOM-wnl, PERRLA, Fundi-benign, EACs-clear, TMs-wnl, NOSE-clear, THROAT-clear & wnl. NECK:  Supple w/ full ROM; no JVD; normal carotid impulses w/o bruits; no thyromegaly or nodules palpated; no lymphadenopathy. CHEST:  Clear to P & A; without wheezes/ rales/ or rhonchi. HEART:  Regular Rhythm; without murmurs/ rubs/ or gallops. ABDOMEN:  Soft & nontender; normal bowel sounds; no organomegaly or masses detected. EXT: without deformities or arthritic changes; no varicose veins/ venous insuffic/ or edema. NEURO:  CN's intact; motor testing normal; sensory testing normal; gait normal & balance OK. DERM:  No lesions noted; no rash etc...  RADIOLOGY DATA:  Reviewed in  the EPIC EMR & discussed w/ the patient...    LABORATORY DATA:  Reviewed in the EPIC EMR & discussed w/ the patient...     Assessment:     Physical Exam>>  AR/ ASTHMA>  On shots from DrVanWinkle every other week & doing well, no complaints or concerns & no asthma x yrs...  HYPERCHOLESTEROLEMIA>  Her parameters have not been at goal but she declines meds and prefers to continue diet & exercise efforts...  HYPOTHYROID>  Stable on Synthroid 59mg/d, TSH is upper limits but clinically stable, continue same for now 7 reminded to take it every day...  Hx ANEMIA>  Hx borderline anemia likely related to menorrhagia; improved & back to normal w/ better iron intake...     Plan:     Patient's Medications  New Prescriptions   No medications on file  Previous Medications   NAPROXEN (NAPROSYN) 500 MG TABLET    As needed for monthly cramps  Modified Medications   Modified Medication Previous Medication   LEVOTHYROXINE (SYNTHROID, LEVOTHROID) 88 MCG TABLET levothyroxine (SYNTHROID, LEVOTHROID) 88 MCG tablet      Take 1 tablet (88 mcg total) by mouth daily.    Take 1 tablet (88 mcg total) by mouth daily.  Discontinued Medications   No medications on file

## 2013-08-26 ENCOUNTER — Other Ambulatory Visit (INDEPENDENT_AMBULATORY_CARE_PROVIDER_SITE_OTHER): Payer: BC Managed Care – PPO

## 2013-08-26 DIAGNOSIS — Z0000 Encounter for general adult medical examination without abnormal findings: Secondary | ICD-10-CM

## 2013-08-26 LAB — URINALYSIS, ROUTINE W REFLEX MICROSCOPIC
Bilirubin Urine: NEGATIVE
Ketones, ur: NEGATIVE
Nitrite: NEGATIVE
Specific Gravity, Urine: <=1.005 — AB (ref 1.000–1.030)
Total Protein, Urine: NEGATIVE
Urine Glucose: NEGATIVE
Urobilinogen, UA: 0.2 (ref 0.0–1.0)
pH: 7 (ref 5.0–8.0)

## 2013-08-26 LAB — CBC WITH DIFFERENTIAL/PLATELET
Basophils Absolute: 0 10*3/uL (ref 0.0–0.1)
Basophils Relative: 0.6 % (ref 0.0–3.0)
Eosinophils Absolute: 0.4 10*3/uL (ref 0.0–0.7)
Eosinophils Relative: 7.1 % — ABNORMAL HIGH (ref 0.0–5.0)
HCT: 40.9 % (ref 36.0–46.0)
Hemoglobin: 13.7 g/dL (ref 12.0–15.0)
Lymphocytes Relative: 29.6 % (ref 12.0–46.0)
Lymphs Abs: 1.6 10*3/uL (ref 0.7–4.0)
MCHC: 33.6 g/dL (ref 30.0–36.0)
MCV: 86 fl (ref 78.0–100.0)
Monocytes Absolute: 0.4 10*3/uL (ref 0.1–1.0)
Monocytes Relative: 7.1 % (ref 3.0–12.0)
Neutro Abs: 3 10*3/uL (ref 1.4–7.7)
Neutrophils Relative %: 55.6 % (ref 43.0–77.0)
Platelets: 241 10*3/uL (ref 150.0–400.0)
RBC: 4.75 Mil/uL (ref 3.87–5.11)
RDW: 13.3 % (ref 11.5–14.6)
WBC: 5.5 10*3/uL (ref 4.5–10.5)

## 2013-08-26 LAB — LIPID PANEL
Cholesterol: 196 mg/dL (ref 0–200)
HDL: 45 mg/dL (ref >39.00)
LDL Cholesterol: 127 mg/dL — ABNORMAL HIGH (ref 0–99)
Total CHOL/HDL Ratio: 4
Triglycerides: 120 mg/dL (ref 0.0–149.0)
VLDL: 24 mg/dL (ref 0.0–40.0)

## 2013-08-26 LAB — HEPATIC FUNCTION PANEL
ALT: 13 U/L (ref 0–35)
AST: 19 U/L (ref 0–37)
Albumin: 4.6 g/dL (ref 3.5–5.2)
Alkaline Phosphatase: 51 U/L (ref 39–117)
Bilirubin, Direct: 0 mg/dL (ref 0.0–0.3)
Total Bilirubin: 0.7 mg/dL (ref 0.3–1.2)
Total Protein: 7.7 g/dL (ref 6.0–8.3)

## 2013-08-26 LAB — BASIC METABOLIC PANEL
BUN: 12 mg/dL (ref 6–23)
CO2: 28 mEq/L (ref 19–32)
Calcium: 9.3 mg/dL (ref 8.4–10.5)
Chloride: 98 mEq/L (ref 96–112)
Creatinine, Ser: 0.7 mg/dL (ref 0.4–1.2)
GFR: 90.81 mL/min (ref >60.00)
Glucose, Bld: 88 mg/dL (ref 70–99)
Potassium: 4.2 mEq/L (ref 3.5–5.1)
Sodium: 134 mEq/L — ABNORMAL LOW (ref 135–145)

## 2013-08-26 LAB — TSH: TSH: 5.21 u[IU]/mL (ref 0.35–5.50)

## 2013-09-03 ENCOUNTER — Telehealth: Payer: Self-pay | Admitting: Pulmonary Disease

## 2013-09-03 NOTE — Telephone Encounter (Signed)
Per pt's chart: Result Notes    Notes Recorded by Elie Confer, CMA on 09/01/2013 at 3:33 PM lmomtcb x 1 ------  Notes Recorded by Noralee Space, MD on 08/29/2013 at 6:28 PM Please notify patient>  FLP looks ok but LDL=127 & ideal is <100; rec better low chol/fat diet... Chems, LFTs, CBC, Thyroid, UA> all essentially wnl/OK...   Called spoke with patient, advised of lab results / recs as stated by SN above.  Pt verbalized her understanding and denied any questions/concerns at this time.  Pt stated she does have a new PCP in mind and will call with an appt so that we can send records.  Nothing further needed at this time; will sign off.

## 2014-01-19 ENCOUNTER — Other Ambulatory Visit (HOSPITAL_COMMUNITY)
Admission: RE | Admit: 2014-01-19 | Discharge: 2014-01-19 | Disposition: A | Discharge status: Home / Self Care | Payer: BC Managed Care – PPO | Source: Ambulatory Visit | Attending: Family Medicine | Admitting: Family Medicine

## 2014-01-19 ENCOUNTER — Other Ambulatory Visit: Payer: Self-pay | Admitting: Family Medicine

## 2014-01-19 DIAGNOSIS — Z01419 Encounter for gynecological examination (general) (routine) without abnormal findings: Secondary | ICD-10-CM | POA: Insufficient documentation

## 2014-01-20 LAB — CYTOLOGY - PAP

## 2014-05-28 IMAGING — CR DG CHEST 2V
2 series · 2 of 2 positions shown · non-contrast
Comparison: 08/02/2010

CLINICAL DATA: Physical exam.  Asymptomatic.

CHEST - 2 VIEW

[view not recorded (1 of 2)]
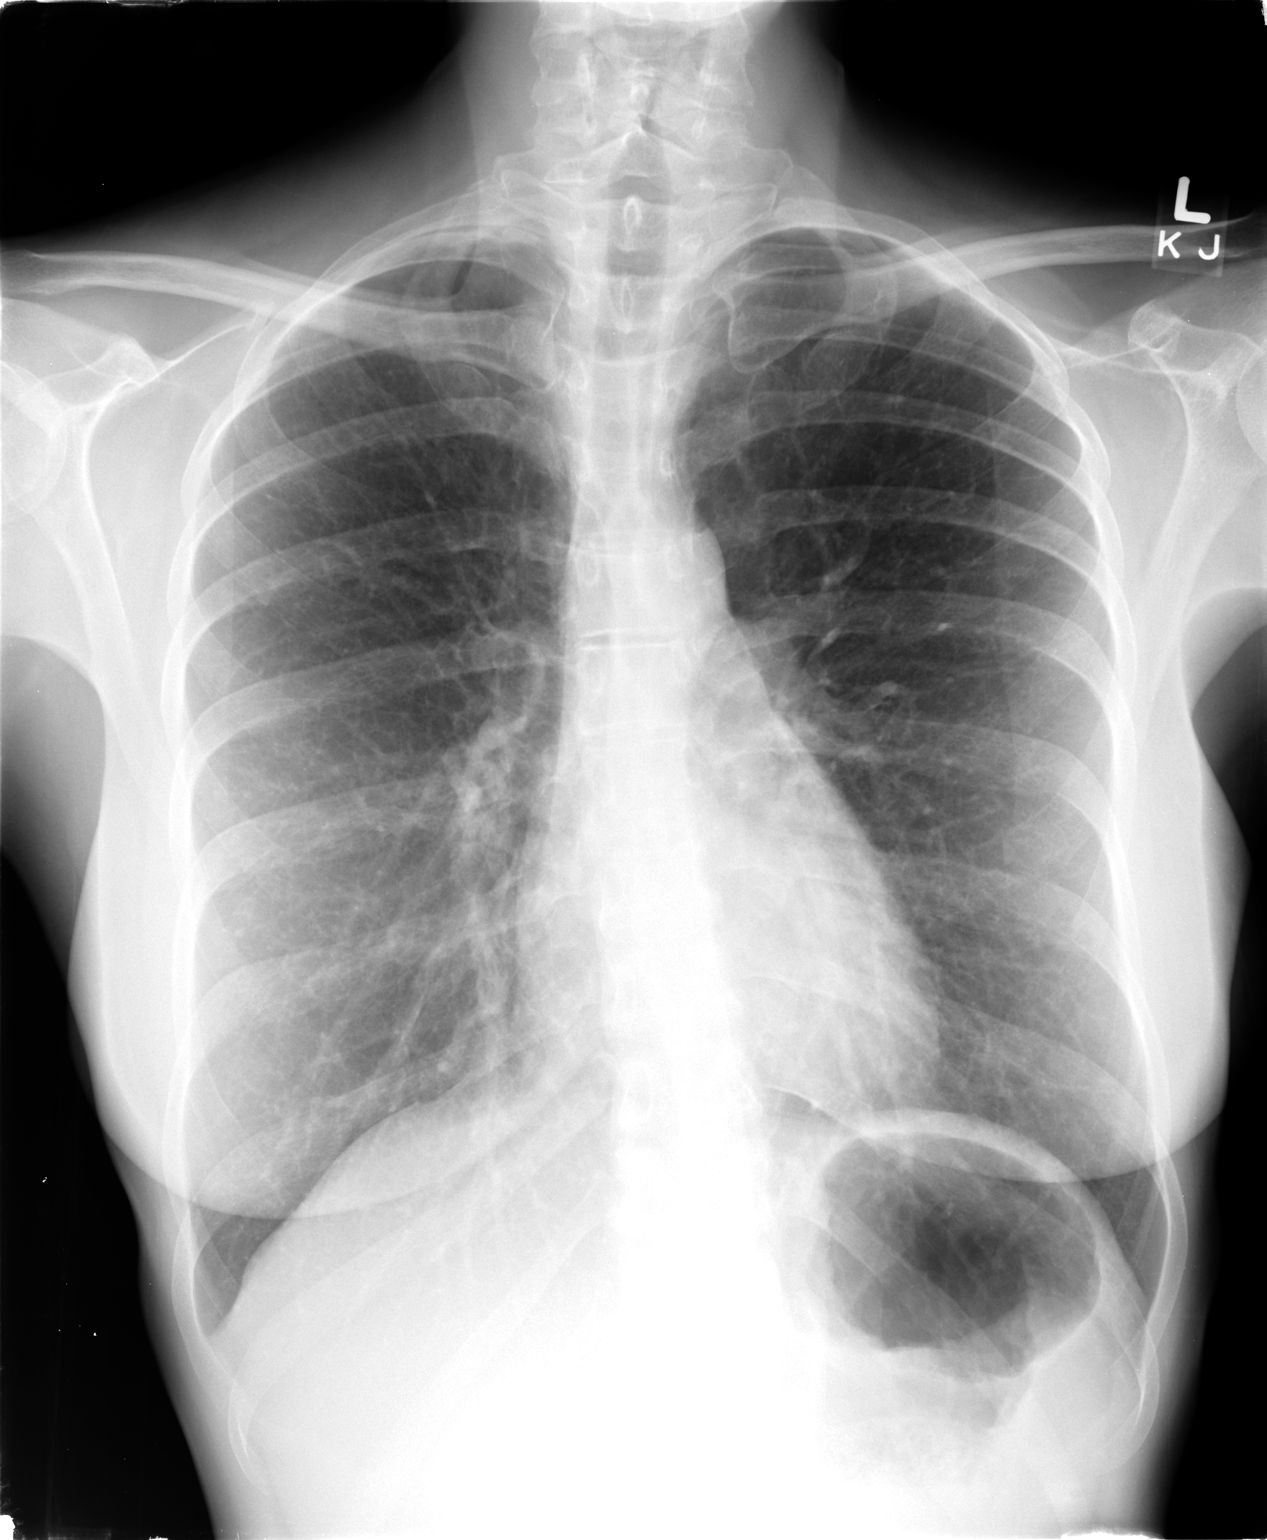

[view not recorded (2 of 2)]
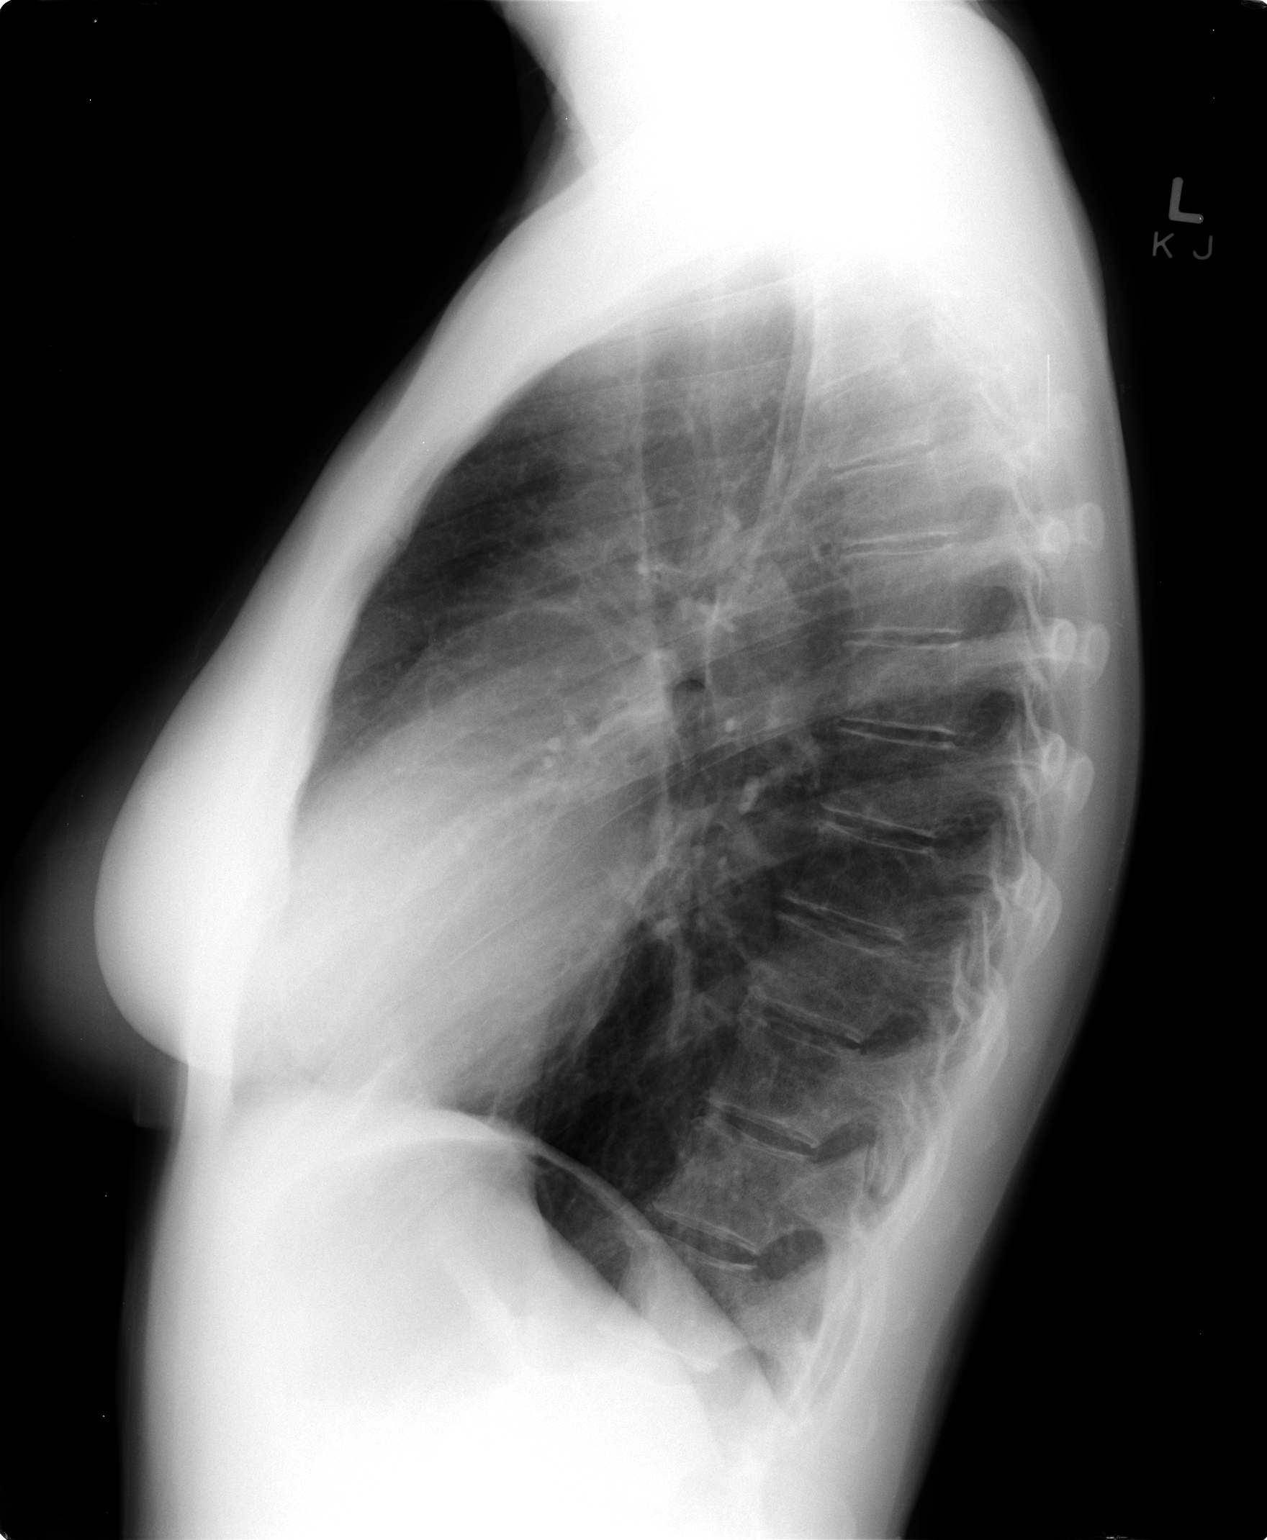

[2 of 2 positions shown; findings below may reference images not displayed]

FINDINGS: Lungs are clear.  Cardiomediastinal silhouette and
remainder of the exam is unchanged.
IMPRESSION: No acute cardiopulmonary disease.

## 2016-09-28 ENCOUNTER — Other Ambulatory Visit: Payer: Self-pay | Admitting: Physician Assistant

## 2016-09-28 ENCOUNTER — Other Ambulatory Visit (HOSPITAL_COMMUNITY)
Admission: RE | Admit: 2016-09-28 | Discharge: 2016-09-28 | Disposition: A | Discharge status: Home / Self Care | Payer: BLUE CROSS/BLUE SHIELD | Source: Ambulatory Visit | Attending: Family Medicine | Admitting: Family Medicine

## 2016-09-28 DIAGNOSIS — Z01419 Encounter for gynecological examination (general) (routine) without abnormal findings: Secondary | ICD-10-CM | POA: Insufficient documentation

## 2016-09-29 LAB — CYTOLOGY - PAP: Diagnosis: NEGATIVE

## 2018-12-11 HISTORY — PX: OTHER SURGICAL HISTORY: SHX169

## 2019-04-15 ENCOUNTER — Other Ambulatory Visit: Payer: Self-pay | Admitting: Obstetrics and Gynecology

## 2019-06-02 ENCOUNTER — Other Ambulatory Visit: Payer: Self-pay | Admitting: Obstetrics and Gynecology

## 2019-07-16 NOTE — H&P (Addendum)
Kim [name] is a 54 y.o. female G:0 presenting  for removal of bilateral complex ovarian cysts and endometrial ablation because of bilateral complex ovarian cysts and menorrhagia. For years the patient has experienced, what she considered, a normal menstrual flow. She described irregular periods occuring every 1-2.5 months and lasting 7-21 days. She has had to change her heavy flow tampon once a day to every 15 minutes at times. When her periods are more on a monthly cycle she will change her tampon every 3 hours. Cramping when it occurs is rated 4/10 but is relieved by Naproxen and often occurs the week following her menses and accompanied by bloating. A pelvic ultrasound at her initial evaluation in 2019 showed an anteverted uterus : 7.82 x 6.03 x 7.46 cm with #5 fibroids: 2.5 cm-intramural, 1.31 cm posterior fundal, 0.8 cm posterior, 1.62 cm anterior subserosal and 1.36 cm mid anterior; endometrium:8.00 mm; right ovary-4.05 cm with a complex 3.5 cm cyst and left ovary-4.00 cm with a complex 2.1 cm cyst. The patient was advised of the complex cysts, recommendation for an endometrial biopsy and given medical management options for her symptoms. She opted to forgo any further evaluation at that time except to consent to follow up ultrasounds and to try topical progesterone cream 14 days each month. With the progesterone cream her menses only lasted 8-14 days and required the change of a pad change every 4 hours. She went on to admit to "lighter" flow days that required less pad change.. She denied any bowel or bladder changes, fever, flank pain or vaginitis symptoms. A series of follow up ultrasounds occurred with the most recent being September 2020 and it showed: an Anteverted Uterus 6.52 x 5.46 x 5.66 cm Multiple small fibroids appears stable , though not as prominent appearing endometrium 2.23 mm appears thin, right ovary 3.88 cm with a 2.9 cm complicated cyst (hemorrhagic vs endometrioma) and left ovary  3.40 cm contains similar complicated cyst measuring 2.2 cm (hemorrhagic vs endometrioma). In August of 2020 the patient was found to have endometrial cells on her PAP Smear. It was at this point that she agreed to an endometrial biopsy that showed no hyperplasia or malignancy. The patient was again given the management options, both medically and surgically for her ultrasound findings and symptoms. She has decided however, to proceed with removal of her complex ovarian cysts and to undergo endometrial ablation for her bleeding symptoms.   Past Medical History  OB History: G:0  GYN History: menarche: 54YO    LMP: 06/22/2019    Contraception: Celibate;  Last PAP smear: 2020 -normal except for endometrial cells-benign endometrial biopsy  Medical History: Thyroid Disease and Asthma  Surgical History: 1984 Jaw Arthroscopy Denies problems with anesthesia or history of blood transfusions.  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  ROS:  Denies headache, vision changes, nasal congestion, dysphagia, tinnitus, dizziness, hoarseness, cough,  chest pain, shortness of breath, nausea, vomiting, diarrhea,constipation,  urinary frequency, urgency  dysuria, hematuria, vaginitis symptoms, pelvic pain, swelling of joints,easy bruising,  myalgias, arthralgias, skin rashes, unexplained weight loss and except as is mentioned in the  history of present illness, patient's review of systems is otherwise negative.   Physical Exam  Bp: 136/76;  P: 76 bpm; R: 16;  Temperature: 97 degrees F  (temporal);  Weight: 141 lbs.;  Height: 5' 6.5";  BMI: 22.4  Neck: supple without masses or thyromegaly Lungs: clear to auscultation Heart: regular rate and rhythm Abdomen: soft, non-tender and no organomegaly Pelvic:EGBUS- wnl; vagina-normal rugae; uterus-normal size, cervix without lesions or motion tenderness; adnexae-no tenderness or masses Extremities:  no clubbing, cyanosis or edema   Assesment: Bilateral Complex Ovarian Cysts                      Menorrhagia                      Uterine Fibroids   Disposition: Reviewed the risks of surgery to include, but not limited to: reaction to anesthesia, damage to adjacent organs, infection and excessive bleeding. The patient verbalized understanding of these risks and has consented to proceed with a Laparoscopic Bilateral Ovarian Cystectomy, Possible Laparotomy, Hysteroscopy, Dilatation, Curettage and Endometrial Ablation at [facility] on July 22, 2019.   CSN# [id number]   [place] J. Florene Glen, PA-C  for Dr Harvie Bridge. Mancel Bale.   Patient had been given the option of lap bilateral cystectomy, hysteroscopy/D&C/Ablation, possible laparotomy vs lap bilateral salpingo-oophorectomy, possible laparotomy.  Patient has decided to have laparoscopic bilateral salpingo-oophorectomy and if by chance there are no cysts on her ovaries, patient wants me to leave the ovaries and just do hysteroscopy/D&C/Ablation.  Many questions answered and consent s/w after r/b/a reviewed including but not limited to b/i/i.

## 2019-07-18 ENCOUNTER — Other Ambulatory Visit (HOSPITAL_COMMUNITY): Payer: BLUE CROSS/BLUE SHIELD

## 2019-07-18 ENCOUNTER — Other Ambulatory Visit (HOSPITAL_COMMUNITY)
Admission: RE | Admit: 2019-07-18 | Discharge: 2019-07-18 | Disposition: A | Discharge status: Home / Self Care | Payer: BC Managed Care – PPO | Source: Ambulatory Visit | Attending: Obstetrics and Gynecology | Admitting: Obstetrics and Gynecology

## 2019-07-18 DIAGNOSIS — Z20822 Contact with and (suspected) exposure to covid-19: Secondary | ICD-10-CM | POA: Insufficient documentation

## 2019-07-18 DIAGNOSIS — Z01812 Encounter for preprocedural laboratory examination: Secondary | ICD-10-CM | POA: Diagnosis not present

## 2019-07-18 LAB — SARS CORONAVIRUS 2 (TAT 6-24 HRS): SARS Coronavirus 2: NEGATIVE

## 2019-07-21 ENCOUNTER — Encounter (HOSPITAL_BASED_OUTPATIENT_CLINIC_OR_DEPARTMENT_OTHER): Payer: Self-pay | Admitting: Obstetrics and Gynecology

## 2019-07-21 ENCOUNTER — Other Ambulatory Visit: Payer: Self-pay

## 2019-07-21 NOTE — Progress Notes (Signed)
Spoke w/ via phone for pre-op interview---Krista Lab needs dos----   Urine preg, cbc, bmet          COVID test ------07-18-2019 Arrive at -------730 am 07-22-2019 NPO after ------midnight Medications to take morning of surgery -----levothyroxine Diabetic medication -----n/a Patient Special Instructions ----- Pre-Op special Istructions ----- Patient verbalized understanding of instructions that were given at this phone interview. Patient denies shortness of breath, chest pain, fever, cough a this phone interview.

## 2019-07-22 ENCOUNTER — Other Ambulatory Visit: Payer: Self-pay

## 2019-07-22 ENCOUNTER — Encounter (HOSPITAL_BASED_OUTPATIENT_CLINIC_OR_DEPARTMENT_OTHER)
Admission: RE | Disposition: A | Discharge status: Home / Self Care | Payer: Self-pay | Source: Home / Self Care | Attending: Obstetrics and Gynecology

## 2019-07-22 ENCOUNTER — Ambulatory Visit (HOSPITAL_BASED_OUTPATIENT_CLINIC_OR_DEPARTMENT_OTHER): Payer: BC Managed Care – PPO | Admitting: Anesthesiology

## 2019-07-22 ENCOUNTER — Encounter (HOSPITAL_BASED_OUTPATIENT_CLINIC_OR_DEPARTMENT_OTHER): Payer: Self-pay | Admitting: Obstetrics and Gynecology

## 2019-07-22 ENCOUNTER — Ambulatory Visit (HOSPITAL_COMMUNITY)
Admission: RE | Admit: 2019-07-22 | Discharge: 2019-07-22 | Disposition: A | Discharge status: Home / Self Care | Payer: BC Managed Care – PPO | Attending: Obstetrics and Gynecology | Admitting: Obstetrics and Gynecology

## 2019-07-22 DIAGNOSIS — D270 Benign neoplasm of right ovary: Secondary | ICD-10-CM | POA: Insufficient documentation

## 2019-07-22 DIAGNOSIS — J45909 Unspecified asthma, uncomplicated: Secondary | ICD-10-CM | POA: Insufficient documentation

## 2019-07-22 DIAGNOSIS — N920 Excessive and frequent menstruation with regular cycle: Secondary | ICD-10-CM | POA: Insufficient documentation

## 2019-07-22 DIAGNOSIS — Z8489 Family history of other specified conditions: Secondary | ICD-10-CM | POA: Diagnosis not present

## 2019-07-22 DIAGNOSIS — N80129 Deep endometriosis of ovary, unspecified ovary: Secondary | ICD-10-CM

## 2019-07-22 DIAGNOSIS — Z8042 Family history of malignant neoplasm of prostate: Secondary | ICD-10-CM | POA: Diagnosis not present

## 2019-07-22 DIAGNOSIS — K660 Peritoneal adhesions (postprocedural) (postinfection): Secondary | ICD-10-CM | POA: Insufficient documentation

## 2019-07-22 DIAGNOSIS — Z8249 Family history of ischemic heart disease and other diseases of the circulatory system: Secondary | ICD-10-CM | POA: Insufficient documentation

## 2019-07-22 DIAGNOSIS — D271 Benign neoplasm of left ovary: Secondary | ICD-10-CM | POA: Insufficient documentation

## 2019-07-22 DIAGNOSIS — Z79899 Other long term (current) drug therapy: Secondary | ICD-10-CM | POA: Insufficient documentation

## 2019-07-22 DIAGNOSIS — N801 Endometriosis of ovary: Secondary | ICD-10-CM | POA: Diagnosis not present

## 2019-07-22 DIAGNOSIS — N939 Abnormal uterine and vaginal bleeding, unspecified: Secondary | ICD-10-CM

## 2019-07-22 DIAGNOSIS — E039 Hypothyroidism, unspecified: Secondary | ICD-10-CM | POA: Insufficient documentation

## 2019-07-22 DIAGNOSIS — IMO0002 Reserved for concepts with insufficient information to code with codable children: Secondary | ICD-10-CM

## 2019-07-22 DIAGNOSIS — N803 Endometriosis of pelvic peritoneum: Secondary | ICD-10-CM

## 2019-07-22 HISTORY — DX: Headache, unspecified: R51.9

## 2019-07-22 HISTORY — DX: Unspecified ovarian cyst, right side: N83.201

## 2019-07-22 HISTORY — DX: Family history of other specified conditions: Z84.89

## 2019-07-22 HISTORY — DX: Anemia, unspecified: D64.9

## 2019-07-22 HISTORY — PX: LAPAROSCOPIC SALPINGO OOPHERECTOMY: SHX5927

## 2019-07-22 HISTORY — DX: Excessive and frequent menstruation with regular cycle: N92.0

## 2019-07-22 LAB — POCT I-STAT, CHEM 8
BUN: 16 mg/dL (ref 6–20)
Calcium, Ion: 1.13 mmol/L — ABNORMAL LOW (ref 1.15–1.40)
Chloride: 103 mmol/L (ref 98–111)
Creatinine, Ser: 0.7 mg/dL (ref 0.44–1.00)
Glucose, Bld: 107 mg/dL — ABNORMAL HIGH (ref 70–99)
HCT: 45 % (ref 36.0–46.0)
Hemoglobin: 15.3 g/dL — ABNORMAL HIGH (ref 12.0–15.0)
Potassium: 4.5 mmol/L (ref 3.5–5.1)
Sodium: 140 mmol/L (ref 135–145)
TCO2: 26 mmol/L (ref 22–32)

## 2019-07-22 LAB — POCT PREGNANCY, URINE: Preg Test, Ur: NEGATIVE

## 2019-07-22 SURGERY — SALPINGO-OOPHORECTOMY, LAPAROSCOPIC
Anesthesia: General | Site: Abdomen | Laterality: Bilateral

## 2019-07-22 MED ORDER — LIDOCAINE 2% (20 MG/ML) 5 ML SYRINGE
INTRAMUSCULAR | Status: AC
  Filled 2019-07-22: qty 5

## 2019-07-22 MED ORDER — KETOROLAC TROMETHAMINE 30 MG/ML IJ SOLN
INTRAMUSCULAR | Status: DC | PRN
  Administered 2019-07-22: 30 mg via INTRAVENOUS

## 2019-07-22 MED ORDER — MIDAZOLAM HCL 5 MG/5ML IJ SOLN
INTRAMUSCULAR | Status: DC | PRN
  Administered 2019-07-22: 2 mg via INTRAVENOUS

## 2019-07-22 MED ORDER — OXYCODONE HCL 5 MG/5ML PO SOLN
5.0000 mg | Freq: Once | ORAL | Status: DC | PRN
  Filled 2019-07-22: qty 5

## 2019-07-22 MED ORDER — SODIUM CHLORIDE 0.9 % IR SOLN
Status: DC | PRN
  Administered 2019-07-22: 3000 mL
  Administered 2019-07-22: 1000 mL

## 2019-07-22 MED ORDER — PROMETHAZINE HCL 25 MG/ML IJ SOLN
6.2500 mg | INTRAMUSCULAR | Status: DC | PRN
  Filled 2019-07-22: qty 1

## 2019-07-22 MED ORDER — KETOROLAC TROMETHAMINE 30 MG/ML IJ SOLN
INTRAMUSCULAR | Status: AC
  Filled 2019-07-22: qty 1

## 2019-07-22 MED ORDER — OXYCODONE HCL 5 MG PO TABS
5.0000 mg | ORAL_TABLET | Freq: Once | ORAL | Status: DC | PRN
  Filled 2019-07-22: qty 1

## 2019-07-22 MED ORDER — BUPIVACAINE HCL (PF) 0.5 % IJ SOLN
INTRAMUSCULAR | Status: DC | PRN
  Administered 2019-07-22: 13 mL

## 2019-07-22 MED ORDER — CEFAZOLIN SODIUM-DEXTROSE 2-4 GM/100ML-% IV SOLN
2.0000 g | INTRAVENOUS | Status: AC
  Administered 2019-07-22: 2 g via INTRAVENOUS
  Filled 2019-07-22: qty 100

## 2019-07-22 MED ORDER — FENTANYL CITRATE (PF) 250 MCG/5ML IJ SOLN
INTRAMUSCULAR | Status: AC
  Filled 2019-07-22: qty 5

## 2019-07-22 MED ORDER — SUGAMMADEX SODIUM 200 MG/2ML IV SOLN
INTRAVENOUS | Status: DC | PRN
  Administered 2019-07-22: 200 mg via INTRAVENOUS

## 2019-07-22 MED ORDER — EPHEDRINE 5 MG/ML INJ
INTRAVENOUS | Status: AC
  Filled 2019-07-22: qty 10

## 2019-07-22 MED ORDER — KETOROLAC TROMETHAMINE 30 MG/ML IJ SOLN
30.0000 mg | Freq: Once | INTRAMUSCULAR | Status: DC | PRN
  Filled 2019-07-22: qty 1

## 2019-07-22 MED ORDER — NAPROXEN 500 MG PO TABS: ORAL_TABLET | ORAL | 2 refills | Status: AC

## 2019-07-22 MED ORDER — DEXAMETHASONE SODIUM PHOSPHATE 10 MG/ML IJ SOLN
INTRAMUSCULAR | Status: AC
  Filled 2019-07-22: qty 1

## 2019-07-22 MED ORDER — DEXAMETHASONE SODIUM PHOSPHATE 10 MG/ML IJ SOLN
INTRAMUSCULAR | Status: DC | PRN
  Administered 2019-07-22: 10 mg via INTRAVENOUS

## 2019-07-22 MED ORDER — HYDROMORPHONE HCL 1 MG/ML IJ SOLN
0.2500 mg | INTRAMUSCULAR | Status: DC | PRN
  Administered 2019-07-22: 0.25 mg via INTRAVENOUS
  Filled 2019-07-22: qty 0.5

## 2019-07-22 MED ORDER — LACTATED RINGERS IV SOLN
INTRAVENOUS | Status: DC
  Filled 2019-07-22: qty 1000

## 2019-07-22 MED ORDER — HYDROMORPHONE HCL 1 MG/ML IJ SOLN
INTRAMUSCULAR | Status: AC
  Filled 2019-07-22: qty 1

## 2019-07-22 MED ORDER — PROPOFOL 10 MG/ML IV BOLUS
INTRAVENOUS | Status: DC | PRN
  Administered 2019-07-22: 200 mg via INTRAVENOUS

## 2019-07-22 MED ORDER — ONDANSETRON HCL 4 MG/2ML IJ SOLN
INTRAMUSCULAR | Status: DC | PRN
  Administered 2019-07-22: 4 mg via INTRAVENOUS

## 2019-07-22 MED ORDER — LIDOCAINE 2% (20 MG/ML) 5 ML SYRINGE
INTRAMUSCULAR | Status: DC | PRN
  Administered 2019-07-22: 60 mg via INTRAVENOUS

## 2019-07-22 MED ORDER — MEPERIDINE HCL 25 MG/ML IJ SOLN
6.2500 mg | INTRAMUSCULAR | Status: DC | PRN
  Filled 2019-07-22: qty 1

## 2019-07-22 MED ORDER — SCOPOLAMINE 1 MG/3DAYS TD PT72
MEDICATED_PATCH | TRANSDERMAL | Status: AC
  Filled 2019-07-22: qty 1

## 2019-07-22 MED ORDER — SCOPOLAMINE 1 MG/3DAYS TD PT72
1.0000 | MEDICATED_PATCH | TRANSDERMAL | Status: DC
  Administered 2019-07-22: 1.5 mg via TRANSDERMAL
  Filled 2019-07-22: qty 1

## 2019-07-22 MED ORDER — FENTANYL CITRATE (PF) 250 MCG/5ML IJ SOLN
INTRAMUSCULAR | Status: DC | PRN
  Administered 2019-07-22: 25 ug via INTRAVENOUS
  Administered 2019-07-22: 50 ug via INTRAVENOUS
  Administered 2019-07-22: 25 ug via INTRAVENOUS
  Administered 2019-07-22: 100 ug via INTRAVENOUS
  Administered 2019-07-22: 50 ug via INTRAVENOUS

## 2019-07-22 MED ORDER — ROCURONIUM BROMIDE 10 MG/ML (PF) SYRINGE
PREFILLED_SYRINGE | INTRAVENOUS | Status: AC
  Filled 2019-07-22: qty 10

## 2019-07-22 MED ORDER — CEFAZOLIN SODIUM-DEXTROSE 2-4 GM/100ML-% IV SOLN
INTRAVENOUS | Status: AC
  Filled 2019-07-22: qty 100

## 2019-07-22 MED ORDER — ROCURONIUM BROMIDE 10 MG/ML (PF) SYRINGE
PREFILLED_SYRINGE | INTRAVENOUS | Status: DC | PRN
  Administered 2019-07-22: 10 mg via INTRAVENOUS
  Administered 2019-07-22: 50 mg via INTRAVENOUS

## 2019-07-22 MED ORDER — ACETAMINOPHEN 500 MG PO TABS
ORAL_TABLET | ORAL | Status: AC
  Filled 2019-07-22: qty 2

## 2019-07-22 MED ORDER — EPHEDRINE SULFATE-NACL 50-0.9 MG/10ML-% IV SOSY
PREFILLED_SYRINGE | INTRAVENOUS | Status: DC | PRN
  Administered 2019-07-22: 15 mg via INTRAVENOUS
  Administered 2019-07-22: 20 mg via INTRAVENOUS

## 2019-07-22 MED ORDER — MIDAZOLAM HCL 2 MG/2ML IJ SOLN
INTRAMUSCULAR | Status: AC
  Filled 2019-07-22: qty 2

## 2019-07-22 MED ORDER — ONDANSETRON HCL 4 MG/2ML IJ SOLN
INTRAMUSCULAR | Status: AC
  Filled 2019-07-22: qty 2

## 2019-07-22 MED ORDER — OXYCODONE-ACETAMINOPHEN 5-325 MG PO TABS: ORAL_TABLET | ORAL | 0 refills | Status: AC

## 2019-07-22 MED ORDER — ACETAMINOPHEN 500 MG PO TABS
1000.0000 mg | ORAL_TABLET | Freq: Once | ORAL | Status: AC
  Administered 2019-07-22: 1000 mg via ORAL
  Filled 2019-07-22: qty 2

## 2019-07-22 SURGICAL SUPPLY — 55 items
ADH SKN CLS APL DERMABOND .7 (GAUZE/BANDAGES/DRESSINGS) ×2
APL PRP STRL LF DISP 70% ISPRP (MISCELLANEOUS)
APL SRG 38 LTWT LNG FL B (MISCELLANEOUS) ×2
APPLICATOR ARISTA FLEXITIP XL (MISCELLANEOUS) ×2 IMPLANT
BAG SPEC RTRVL LRG 6X4 10 (ENDOMECHANICALS) ×4
CABLE HIGH FREQUENCY MONO STRZ (ELECTRODE) ×2 IMPLANT
CANISTER SUCT 3000ML PPV (MISCELLANEOUS) ×2 IMPLANT
CATH ROBINSON RED A/P 16FR (CATHETERS) ×1 IMPLANT
CHLORAPREP W/TINT 26 (MISCELLANEOUS) IMPLANT
CLOTH BEACON ORANGE TIMEOUT ST (SAFETY) ×3 IMPLANT
COVER WAND RF STERILE (DRAPES) ×3 IMPLANT
DERMABOND ADVANCED (GAUZE/BANDAGES/DRESSINGS) ×1
DERMABOND ADVANCED .7 DNX12 (GAUZE/BANDAGES/DRESSINGS) ×2 IMPLANT
DILATOR CANAL MILEX (MISCELLANEOUS) IMPLANT
DISSECTOR BLUNT TIP ENDO 5MM (MISCELLANEOUS) ×2 IMPLANT
DRSG OPSITE POSTOP 3X4 (GAUZE/BANDAGES/DRESSINGS) ×3 IMPLANT
DURAPREP 26ML APPLICATOR (WOUND CARE) ×3 IMPLANT
FORCEPS CUTTING 45CM 5MM (CUTTING FORCEPS) ×3 IMPLANT
GAUZE 4X4 16PLY RFD (DISPOSABLE) ×3 IMPLANT
GLOVE BIO SURGEON STRL SZ7.5 (GLOVE) ×5 IMPLANT
GLOVE BIOGEL PI IND STRL 7.0 (GLOVE) ×5 IMPLANT
GLOVE BIOGEL PI IND STRL 7.5 (GLOVE) ×4 IMPLANT
GLOVE BIOGEL PI INDICATOR 7.0 (GLOVE) ×3
GLOVE BIOGEL PI INDICATOR 7.5 (GLOVE) ×2
GLOVE SURG SS PI 7.0 STRL IVOR (GLOVE) ×5 IMPLANT
GOWN STRL REUS W/ TWL XL LVL3 (GOWN DISPOSABLE) ×1 IMPLANT
GOWN STRL REUS W/TWL XL LVL3 (GOWN DISPOSABLE) ×6 IMPLANT
HEMOSTAT ARISTA ABSORB 3G PWDR (HEMOSTASIS) ×2 IMPLANT
HIBICLENS CHG 4% 4OZ (MISCELLANEOUS) ×1 IMPLANT
IV NS IRRIG 3000ML ARTHROMATIC (IV SOLUTION) ×3 IMPLANT
KIT TURNOVER CYSTO (KITS) ×3 IMPLANT
NEEDLE INSUFFLATION 120MM (ENDOMECHANICALS) ×3 IMPLANT
NS IRRIG 1000ML POUR BTL (IV SOLUTION) ×2 IMPLANT
PACK LAPAROSCOPY BASIN (CUSTOM PROCEDURE TRAY) ×3 IMPLANT
PACK TRENDGUARD 450 HYBRID PRO (MISCELLANEOUS) ×2 IMPLANT
PACK VAGINAL MINOR WOMEN LF (CUSTOM PROCEDURE TRAY) ×3 IMPLANT
PAD OB MATERNITY 4.3X12.25 (PERSONAL CARE ITEMS) ×3 IMPLANT
PAD PREP 24X48 CUFFED NSTRL (MISCELLANEOUS) ×3 IMPLANT
POUCH SPECIMEN RETRIEVAL 10MM (ENDOMECHANICALS) ×4 IMPLANT
SET GENESYS HTA PROCERVA (MISCELLANEOUS) ×1 IMPLANT
SET IRRIG TUBING LAPAROSCOPIC (IRRIGATION / IRRIGATOR) ×2 IMPLANT
SLEEVE XCEL OPT CAN 5 100 (ENDOMECHANICALS) IMPLANT
SUT MNCRL AB 3-0 PS2 27 (SUTURE) ×3 IMPLANT
SUT VICRYL 0 ENDOLOOP (SUTURE) IMPLANT
SUT VICRYL 0 TIES 12 18 (SUTURE) IMPLANT
SUT VICRYL 0 UR6 27IN ABS (SUTURE) ×3 IMPLANT
SUT VICRYL 4-0 PS2 18IN ABS (SUTURE) IMPLANT
SYR 50ML LL SCALE MARK (SYRINGE) ×3 IMPLANT
TRAY FOLEY W/BAG SLVR 14FR LF (SET/KITS/TRAYS/PACK) ×3 IMPLANT
TRENDGUARD 450 HYBRID PRO PACK (MISCELLANEOUS) ×3
TROCAR XCEL NON-BLD 11X100MML (ENDOMECHANICALS) ×5 IMPLANT
TROCAR XCEL NON-BLD 5MMX100MML (ENDOMECHANICALS) ×3 IMPLANT
TUBING AQUILEX INFLOW (TUBING) IMPLANT
WARMER LAPAROSCOPE (MISCELLANEOUS) ×3 IMPLANT
WATER STERILE IRR 500ML POUR (IV SOLUTION) ×3 IMPLANT

## 2019-07-22 NOTE — Anesthesia Procedure Notes (Signed)
Procedure Name: Intubation Date/Time: 07/22/2019 10:09 AM Performed by: Talbot Grumbling, CRNA Pre-anesthesia Checklist: Patient identified, Emergency Drugs available, Suction available and Patient being monitored Patient Re-evaluated:Patient Re-evaluated prior to induction Oxygen Delivery Method: Circle system utilized Preoxygenation: Pre-oxygenation with 100% oxygen Induction Type: IV induction Ventilation: Mask ventilation without difficulty Laryngoscope Size: Mac and 3 Grade View: Grade I Tube type: Oral Tube size: 7.0 mm Number of attempts: 1 Airway Equipment and Method: Stylet Placement Confirmation: ETT inserted through vocal cords under direct vision,  positive ETCO2 and breath sounds checked- equal and bilateral Secured at: 22 cm Tube secured with: Tape Dental Injury: Teeth and Oropharynx as per pre-operative assessment

## 2019-07-22 NOTE — Discharge Instructions (Signed)
Call [facility] @ [phone] if:  You have a temperature greater than or equal to 100.4 degrees Farenheit orally You have pain that is not made better by the pain medication given and taken as directed You have excessive bleeding or problems urinating  Take Colace (Docusate Sodium/Stool Softener) 100 mg 2-3 times daily while taking narcotic pain medicine to avoid constipation or until bowel movements are regular. Take, with food, Naproxen 500 mg twice a day for 5 days  (first dose at 6 pm on day of surgery),  then as needed for pain  You may drive after 36 hours You may walk up steps  You may shower tomorrow   Post Anesthesia Home Care Instructions  Activity: Get plenty of rest for the remainder of the day. A responsible individual must stay with you for 24 hours following the procedure.  For the next 24 hours, DO NOT: -Drive a car -Paediatric nurse -Drink alcoholic beverages -Take any medication unless instructed by your physician -Make any legal decisions or sign important papers.  Meals: Start with liquid foods such as gelatin or soup. Progress to regular foods as tolerated. Avoid greasy, spicy, heavy foods. If nausea and/or vomiting occur, drink only clear liquids until the nausea and/or vomiting subsides. Call your physician if vomiting continues.  Special Instructions/Symptoms: Your throat may feel dry or sore from the anesthesia or the breathing tube placed in your throat during surgery. If this causes discomfort, gargle with warm salt water. The discomfort should disappear within 24 hours.  If you had a scopolamine patch placed behind your ear for the management of post- operative nausea and/or vomiting:  1. The medication in the patch is effective for 72 hours, after which it should be removed.  Wrap patch in a tissue and discard in the trash. Wash hands thoroughly with soap and water. 2. You may remove the patch earlier than 72 hours if you experience  unpleasant side effects which may include dry mouth, dizziness or visual disturbances. 3. Avoid touching the patch. Wash your hands with soap and water after contact with the patch.      You may resume a regular diet  Keep incisions clean and dry Do not lift over 15 pounds for 2 weeks Avoid anything in vagina  until after your post-operative visit

## 2019-07-22 NOTE — Anesthesia Preprocedure Evaluation (Addendum)
Anesthesia Evaluation  Patient identified by MRN, date of birth, ID band Patient awake    Reviewed: Allergy & Precautions, NPO status , Patient's Chart, lab work & pertinent test results  History of Anesthesia Complications (+) DIFFICULT IV STICK / SPECIAL LINENegative for: history of anesthetic complications  Airway Mallampati: II  TM Distance: >3 FB Neck ROM: Full    Dental no notable dental hx. (+) Teeth Intact, Dental Advisory Given   Pulmonary neg pulmonary ROS,    Pulmonary exam normal breath sounds clear to auscultation       Cardiovascular negative cardio ROS Normal cardiovascular exam Rhythm:Regular Rate:Normal     Neuro/Psych  Headaches, negative psych ROS   GI/Hepatic negative GI ROS, Neg liver ROS,   Endo/Other  Hypothyroidism   Renal/GU negative Renal ROS   Bilateral ovarian cysts, menorrhagia    Musculoskeletal negative musculoskeletal ROS (+)   Abdominal   Peds negative pediatric ROS (+)  Hematology negative hematology ROS (+)   Anesthesia Other Findings   Reproductive/Obstetrics negative OB ROS                            Anesthesia Physical Anesthesia Plan  ASA: II  Anesthesia Plan: General   Post-op Pain Management:    Induction: Intravenous  PONV Risk Score and Plan: 4 or greater and Ondansetron, Dexamethasone, Midazolam, Scopolamine patch - Pre-op and Treatment may vary due to age or medical condition  Airway Management Planned: Oral ETT  Additional Equipment: None  Intra-op Plan:   Post-operative Plan: Extubation in OR  Informed Consent: I have reviewed the patients History and Physical, chart, labs and discussed the procedure including the risks, benefits and alternatives for the proposed anesthesia with the patient or authorized representative who has indicated his/her understanding and acceptance.     Dental advisory given  Plan Discussed with:  CRNA  Anesthesia Plan Comments:         Anesthesia Quick Evaluation

## 2019-07-22 NOTE — Transfer of Care (Signed)
Immediate Anesthesia Transfer of Care Note  Patient: Kim [name]  Procedure(s) Performed: LAPAROSCOPIC SALPINGO OOPHORECTOMY (Bilateral Abdomen)  Patient Location: PACU  Anesthesia Type:General  Level of Consciousness: sedated  Airway & Oxygen Therapy: Patient Spontanous Breathing and Patient connected to face mask oxygen  Post-op Assessment: Report given to RN and Post -op Vital signs reviewed and stable  Post vital signs: Reviewed and stable  Last Vitals:  Vitals Value Taken Time  BP    Temp    Pulse    Resp    SpO2      Last Pain:  Vitals:   07/22/19 0842  TempSrc: Oral  PainSc: 0-No pain      Patients Stated Pain Goal: 4 ([phone]  Complications: No apparent anesthesia complications

## 2019-07-22 NOTE — Op Note (Addendum)
Preop Diagnosis: 1.Bilateral Ovarian Cysts 2.Menorrhagia   Postop Diagnosis: 1.Bilateral Ovarian Cysts 2. Endometriosis 3.Menorrhagia   Procedure: 1.LAPAROSCOPIC BILATERAL SALPINGO-OOPHORECTOMY 2.LOA  Anesthesia: General   Anesthesiologist: Pervis Hocking, DO   Attending: Everett Graff, MD   Assistant:  E. Florene Glen, PA-C  Findings: Bilateral endometriomas (chocolate cysts and multiple adhesions of bowel to rt adnexa and small omental adhesion to anterior abdominal wall), normal appearing rt fallopian tube and lt tube adherent with ovary.  Pathology: Bilateral ovaries and tubes  Fluids: 1500 cc  UOP: 500 cc  EBL: 25 cc  Complications: None  Procedure:  The patient was taken to the operating room after the risks, benefits, alternatives, complications, treatment options, and expected outcomes were discussed with the patient. The patient verbalized understanding, the patient concurred with the proposed plan and consent signed and witnessed. The patient was taken to the Operating Room and identified as Kim [name] and the procedure verified as Kim [name].  The patient was placed under general anesthesia per anesthesia staff, the patient was placed in modified dorsal lithotomy position and was prepped, draped, and catheterized in the normal, sterile fashion.  A Time Out was held and the above information confirmed.  The cervix was visualized and an intrauterine manipulator was placed.  A 10 mm umbilical incision was then performed. Veress needle was passed and pneumoperitoneum was established. A 10 mm trocar was advanced into the intraabdominal cavity, the laparoscope was introduced and findings as noted above.  Patient was placed in trendelenburg and marcaine injected in the LLQ and a 5 mm incision was made and 5 mm trocar advanced into the intraabdominal cavity.  The same was done in the RLQ and a 10 mm in the suprapubic area.   The Gyrus tripolar was used to  excise adhesions and the IP bilaterally and tube and ovaries removed in a pouch through 82mm suprapubic incision.  There was a small (approx half centimeter or less) portion of rt ovary adherent to bowel and rt side wall which remained in-situ. Bilateral ureters appeared to be away from surgical area and peristalsis was noted bilaterally.  The 10 mm suprapubic trocar was removed and fascia repaired with 0 vicryl.  The skin at this site was repaired with 3-0 monocryl via a subcuticular stitch.  Right and left trocars were removed as well under direct visualization.  Pneumoperitoneum was relieved and umbilical trocar removed under direct visualization.  The umbilical fascia was repaired with 0 vicryl.  The 10 mm skin incision at this site was repaired with 3-0 monocryl via a subcuticular stitch and dermabond was applied to all incisions.  Sponge, instrument, lap and needle counts were correct.  The patient tolerated the procedure well and was awaiting extubation and transfer to the recovery room.  Pam, circulating RN, took foley and hulka out prior to transfer to recovery room.

## 2019-07-22 NOTE — Anesthesia Postprocedure Evaluation (Signed)
Anesthesia Post Note  Patient: Kim [name]  Procedure(s) Performed: LAPAROSCOPIC SALPINGO OOPHORECTOMY (Bilateral Abdomen)     Patient location during evaluation: PACU Anesthesia Type: General Level of consciousness: awake and alert, oriented and patient cooperative Pain management: pain level controlled Vital Signs Assessment: post-procedure vital signs reviewed and stable Respiratory status: spontaneous breathing, nonlabored ventilation and respiratory function stable Cardiovascular status: blood pressure returned to baseline and stable Postop Assessment: no apparent nausea or vomiting Anesthetic complications: no    Last Vitals:  Vitals:   07/22/19 0842 07/22/19 1219  BP: (!) 146/92 123/64  Pulse: (!) 107 86  Resp: 16 12  Temp: 36.4 C 36.6 C  SpO2: 100% 99%    Last Pain:  Vitals:   07/22/19 1219  TempSrc:   PainSc: [place]

## 2019-07-22 NOTE — Anesthesia Postprocedure Evaluation (Signed)
Anesthesia Post Note  Patient: Kim [name]  Procedure(s) Performed: LAPAROSCOPIC SALPINGO OOPHORECTOMY (Bilateral Abdomen)     Patient location during evaluation: PACU Anesthesia Type: General Level of consciousness: awake and alert, oriented and patient cooperative Pain management: pain level controlled Vital Signs Assessment: post-procedure vital signs reviewed and stable Respiratory status: spontaneous breathing, nonlabored ventilation and respiratory function stable Cardiovascular status: blood pressure returned to baseline and stable Postop Assessment: no apparent nausea or vomiting Anesthetic complications: no    Last Vitals:  Vitals:   07/22/19 1240 07/22/19 1245  BP:    Pulse: (!) 118 (!) 116  Resp: 17 18  Temp:    SpO2: 100% 100%    Last Pain:  Vitals:   07/22/19 1245  TempSrc:   PainSc: [place]

## 2019-07-24 LAB — SURGICAL PATHOLOGY

## 2020-03-29 ENCOUNTER — Other Ambulatory Visit: Payer: Self-pay

## 2020-03-29 ENCOUNTER — Ambulatory Visit (INDEPENDENT_AMBULATORY_CARE_PROVIDER_SITE_OTHER): Payer: BC Managed Care – PPO | Admitting: Psychiatry

## 2020-03-29 DIAGNOSIS — F411 Generalized anxiety disorder: Secondary | ICD-10-CM | POA: Diagnosis not present

## 2020-03-29 NOTE — Progress Notes (Signed)
[facility] Counselor Initial Adult Exam  Name: Kim [name] Date: 03/29/2020 MRN: [id number] DOB: 11/18/1965 PCP: Noralee Space, MD  Time spent: 60 minutes  7:55am to 8:55am   Guardian/Payee:  patient  Paperwork requested:  No   Reason for Visit /Presenting Problem: anxiety, some tearfulness  Mental Status Exam:   Appearance:   Casual     Behavior:  Appropriate, Sharing and Motivated  Motor:  Normal  Speech/Language:   Clear and Coherent  Affect:  Tearful and anxious  Mood:  anxious  Thought process:  normal  Thought content:    some obsessiveness  Sensory/Perceptual disturbances:    WNL  Orientation:  oriented to person, place, time/date, situation, day of week, month of year and year  Attention:  Good  Concentration:  Good  Memory:  WNL  Fund of knowledge:   Good  Insight:    Good  Judgment:   Good  Impulse Control:  Good   Reported Symptoms:  See symptoms listed above.  Risk Assessment: Danger to Self:  No Self-injurious Behavior: No Danger to Others: No Duty to Warn:no Physical Aggression / Violence:No  Access to Firearms a concern: No  Gang Involvement:No  Patient / guardian was educated about steps to take if suicide or homicide risk level increases between visits: Patient denies and SI. While future psychiatric events cannot be accurately predicted, the patient does not currently require acute inpatient psychiatric care and does not currently meet [facility] involuntary commitment criteria.  Substance Abuse History: Current substance abuse: No     Past Psychiatric History:   Previous psychological history is significant for anxiety Outpatient Providers:n/a  "been a long time ago" History of Psych Hospitalization: No  Psychological Testing: no   Abuse History: Victim of No., n/a   Report needed: No. Victim of Neglect:No. Perpetrator of n/a  Witness / Exposure to Domestic Violence: No   Protective Services Involvement: No  Witness to  [employer] Violence:  No   Family History: No family history on file. Both parents still living close by and are supportive. No past history of traumas.  Mom also struggles with anxiety. Was very close to uncle that died 18-Feb-2023. Patient is mental Ambulance person.  Living situation: the patient lives alone  Sexual Orientation:  Straight  Relationship Status: single never married  Name of spouse / other:  N/A             If a parent, number of children / ages: no kids  Support Systems; friends parents  Museum/gallery curator Stress:  No   Income/Employment/Disability: Employment  Armed forces logistics/support/administrative officer: No   Educational History: Education: post Forensic psychologist work or degree  Religion/Sprituality/World View:   Protestant  Any cultural differences that may affect / interfere with treatment:  not applicable   Recreation/Hobbies: shopping, scrapbooking, being with friends  Stressors:Loss of death of uncle she was close to and it was unexpected Feb 18, 2023  Strengths:  Supportive Relationships, Family, Friends, Social worker, Spirituality, Hopefulness, Conservator, museum/gallery and Able to Communicate Effectively  Barriers:   work related stressors  Legal History: Pending legal issue / charges: The patient has no significant history of legal issues. History of legal issue / charges: n/a  Medical History/Surgical History:Reviewed with patient and she confirms info below: Past Medical History:  Diagnosis Date  . Allergic rhinitis   . Anemia   . Bilateral ovarian cysts   . Family history of adverse reaction to anesthesia    grandfather awake but could not move 8  yrs ago, not sure about  . Headache   . History of asthma   . Menorrhagia   . Other and unspecified hyperlipidemia   . Unspecified hypothyroidism     Past Surgical History:  Procedure Laterality Date  . colonscopy  12/2018  . jaws wired together for teeth alignment  age 41  . LAPAROSCOPIC SALPINGO OOPHERECTOMY Bilateral 07/22/2019   Procedure:  LAPAROSCOPIC SALPINGO OOPHORECTOMY;  Surgeon: Everett Graff, MD;  [facility]: [facility];  Service: Gynecology;  Laterality: Bilateral;    Medications: Current Outpatient Medications  Medication Sig Dispense Refill  . Ferrous Sulfate Dried (HIGH POTENCY IRON) 65 MG TABS Take by mouth daily.    Marland Kitchen levothyroxine (SYNTHROID) 125 MCG tablet Take 125 mcg by mouth daily before breakfast.    . naproxen (NAPROSYN) 500 MG tablet take 1 tablet po pc bid x 5 days, then prn-post operative pain (first dose 6 pm day of surgery) 60 tablet 2  . oxyCODONE-acetaminophen (PERCOCET/ROXICET) 5-325 MG tablet take 1 tablet po every 6 hours for breakthrough post operative pain 20 tablet 0  . UNABLE TO FIND Vitamin d 3 1000 iu daily     No current facility-administered medications for this visit.    No Known Allergies  Today reports seasonal allergies of pollen.   Diagnoses:    ICD-10-CM   1. Generalized anxiety disorder  F41.1     Plan of Care:  Patient not signing treatment plan on computer screen due to Covid.  Treatment Goal Plan: Goals remain on treatment plan as patient works with strategies to achieve her goals.  Progress will be documented each session and noted in "Progress" section of treatment plan.  Long term goal: Reduce overall level, frequency, and intensity of the anxiety so that daily functioning is not impaired.  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   Short term goal: Identify, challenge, and replace anxious/fearful self-talk with positive, reality based, and empowering self talk.  Strategies: Teach the patient to implement "thought stopping" techniques for anxieties and worries that have been addressed but persist.  Progress: Today is patient's first session and we completed the initial evaluation for treatment and then her initial treatment goal plan. Patient is a 54 yr old single female living alone, with  no children. Never married. Parents are divorced and live locally and are supportive of patient. No siblings. Was close to uncle that died 13-Feb-2023 and had been sick but not expected to die.  Has close friends. Attend church regularly. Grew up in [place], [place].  Attended [employer] and [employer] in [place].  Health status she reports is good except some rise in BP with her anxiety.  States "I do not want to have all this anxiety but some days I have good days".  Shares that her increasing anxiety started prior to the uncle that died on 02/13/2023 with whom she was very close.  She feels that work is triggered her increased anxiety.  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  States that her work team has had some issues and some people lef. Because the group is not doing well she feels like she cannot make a mistake and that is extra pressure.  Today anxiety is a "5" on 1-10 scale and it fluctuates.  Feeling overwhelmed and easily holds herself responsible when patients do  not do their part to get better.  She is pleased to be beginning therapy and feels like it will also be helpful for her to "be able to talk about my job with a counselor who would understand some of the stressors and issues."   Goal review with patient and progress or lack of progress will be noted each session.  Next appointment within 2 weeks.   Shanon Ace, LCSW

## 2020-04-15 ENCOUNTER — Ambulatory Visit (INDEPENDENT_AMBULATORY_CARE_PROVIDER_SITE_OTHER): Payer: BC Managed Care – PPO | Admitting: Psychiatry

## 2020-04-15 ENCOUNTER — Other Ambulatory Visit: Payer: Self-pay

## 2020-04-15 DIAGNOSIS — F411 Generalized anxiety disorder: Secondary | ICD-10-CM | POA: Diagnosis not present

## 2020-04-15 NOTE — Progress Notes (Signed)
[facility] Counselor/Therapist Progress Note  Patient ID: SHAHD OCCHIPINTI, MRN: [id number],    Date: 04/15/2020  Time Spent: 60 minutes    12:00noon to 1:00pm   Treatment Type: Individual Therapy  Reported Symptoms: anxiety  Mental Status Exam:  Appearance:   Neat     Behavior:  Appropriate, Sharing and Motivated  Motor:  Normal  Speech/Language:   Clear and Coherent  Affect:  anxious  Mood:  anxious  Thought process:  normal  Thought content:    WNL  Sensory/Perceptual disturbances:    WNL  Orientation:  oriented to person, place, time/date, situation, day of week, month of year and year  Attention:  Good  Concentration:  Good  Memory:  WNL  Fund of knowledge:   Good  Insight:    Good  Judgment:   Good  Impulse Control:  Good   Risk Assessment: Danger to Self:  No Self-injurious Behavior: No Danger to Others: No Duty to Warn:no Physical Aggression / Violence:No  Access to Firearms a concern: No  Gang Involvement:No   Subjective: Patient today reports the past couple weeks have been better with less anxiety.  Feels that she" managed anxiety better when it did arise."  Interventions: Cognitive Behavioral Therapy and Solution-Oriented/Positive Psychology  Diagnosis:   ICD-10-CM   1. Generalized anxiety disorder  F41.1     Plan of Care:  Patient not signing treatment plan on computer screen due to Covid.  Treatment Goal Plan: Goals remain on treatment plan as patient works with strategies to achieve her goals.  Progress will be documented each session and noted in "Progress" section of treatment plan.  Long term goal: Reduce overall level, frequency, and intensity of the anxiety so that daily functioning is not impaired.  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   Short term goal: Identify, challenge, and replace anxious/fearful self-talk with positive, reality based, and empowering self  talk.  Strategies: Teach the patient to implement "thought stopping" techniques for anxieties and worries that have been addressed but persist.  Progress: Patient in today reporting some decreased anxiety past 2 weeks, but did have some that she managed better. Anxiety is less but definitely still present.  On 1-10 scale for anxiety, "it fluctuates but today is a "3" and on previous days past week it's been between a "2 and a 4".  Feels some of her anxiety has been job-related and also perhaps connected to some physical concerns earlier.  Discussed some job-related situations as she is a therapist and has a chance that some possible changes in the near future, which we discussed today, as well as some issues within her current job and some rearranges that are uncertain at this time.  Processed her thoughts and feelings about all of these projected changes and seemed to feel more calm and grounded by session and.  Was able to tag how her anxiety peaks at times of uncertainty and change, and how she can better manage during those times with having people to talk to and better self-care including being outside some every day, healthy nutrition and exercise, trying to stay in the present, focusing on what she can control versus what she cannot control, avoiding self-blame, having good boundaries with other people including her immediate coworkers and supervisor, avoiding perfectionistic expectations, and using positive self talk.  Goal review and progress/challenges noted with patient.  Next appointment within 2 weeks.  Shanon Ace, LCSW

## 2020-04-29 ENCOUNTER — Ambulatory Visit: Payer: BC Managed Care – PPO | Admitting: Psychiatry

## 2020-05-12 ENCOUNTER — Ambulatory Visit: Payer: BC Managed Care – PPO | Admitting: Psychiatry

## 2020-05-17 ENCOUNTER — Ambulatory Visit (INDEPENDENT_AMBULATORY_CARE_PROVIDER_SITE_OTHER): Payer: BC Managed Care – PPO | Admitting: Psychiatry

## 2020-05-17 ENCOUNTER — Other Ambulatory Visit: Payer: Self-pay

## 2020-05-17 DIAGNOSIS — F411 Generalized anxiety disorder: Secondary | ICD-10-CM

## 2020-05-17 NOTE — Progress Notes (Signed)
[facility] Counselor/Therapist Progress Note  Patient ID: Kim [name], MRN: [id number],    Date: 05/17/2020  Time Spent: 60 minutes   9:00am to 10:00am  Treatment Type: Individual Therapy  Reported Symptoms: anxiety, fear of getting diabetes even though her Dr says she has no symptoms and there's not a family history  Mental Status Exam:  Appearance:   Casual     Behavior:  Appropriate, Sharing and Motivated  Motor:  Normal  Speech/Language:   Clear and Coherent  Affect:  anxious  Mood:  anxious  Thought process:  goal directed  Thought content:    WNL  Sensory/Perceptual disturbances:    WNL  Orientation:  oriented to person, place, time/date, situation, day of week, month of year and year  Attention:  Good  Concentration:  Good  Memory:  WNL  Fund of knowledge:   Good  Insight:    Good  Judgment:   Good  Impulse Control:  Good   Risk Assessment: Danger to Self:  No Self-injurious Behavior: No Danger to Others: No Duty to Warn:no Physical Aggression / Violence:No  Access to Firearms a concern: No  Gang Involvement:No   Subjective: Patient today reports anxiety. Anxiety has decreased some, and have had more "normal" days "when I'm not as bothered by things."  Interventions: Solution-Oriented/Positive Psychology and Ego-Supportive  Diagnosis:   ICD-10-CM   1. Generalized anxiety disorder  F41.1      Plan of Care: Patient not signing treatment plan on computer screen due to Covid.  Treatment Goal Plan: Goals remain on treatment plan as patient works with strategies to achieve her goals. Progress will be documented each session and noted in "Progress" section of treatment plan.  Long term goal: Reduce overall level, frequency, and intensity of the anxiety so that daily functioning is not impaired.  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   Short term goal: Identify, challenge, and  replace anxious/fearful self-talk with positive, reality based, and empowering self talk.  Strategies: Teach the patientto implement "thought stopping" techniques for anxieties and worries that have been addressed but persist.  Progress: Patient in today reporting anxiety, but am some better and not as bothered by things nor re-occurring anxiety.  But has had more anxiety since last summer.  Feels stress has contributed to her anxiety, reflecting back on on past year+. Overly stressed with work and demands there, and it was stressful for her friend to be her boss. Some stressors with mom who lives locally in high-rise with others. Worked today more specifically with her anxiety and some CBT re: recognizing thought patterns that lead to and/or support her anxiety. Talked through some examples in session and is to practice more thought interruption using the Strategy and short-term goal above in treatment plan, as she continues to work on her anxiety reduction between sessions. Anxiety has been higher some more recently,rating it a "4-5 on 1-10 pt scale" and has been mostly job and personal/family.  Encouraged healthier self talk and self-care including setting limits in situations that tend to be anxiety provoking, getting more sleep including going to bed earlier, getting some each day and maintaining some healthy relationships with others, healthy nutrition and exercise, choosing not to self blame, staying in the present and focusing on what she can control versus cannot, and setting good boundaries with others as needed.  Goal review and progress/challenges noted with patient.  Next appointment within 2 to 3 weeks.    Shanon Ace, LCSW

## 2020-05-20 ENCOUNTER — Ambulatory Visit: Payer: BC Managed Care – PPO | Admitting: Psychiatry

## 2020-06-17 ENCOUNTER — Ambulatory Visit: Payer: BC Managed Care – PPO | Admitting: Psychiatry

## 2020-07-01 ENCOUNTER — Other Ambulatory Visit: Payer: Self-pay

## 2020-07-01 ENCOUNTER — Ambulatory Visit (INDEPENDENT_AMBULATORY_CARE_PROVIDER_SITE_OTHER): Payer: 59 | Admitting: Psychiatry

## 2020-07-01 DIAGNOSIS — F411 Generalized anxiety disorder: Secondary | ICD-10-CM | POA: Diagnosis not present

## 2020-07-01 NOTE — Progress Notes (Signed)
[facility] Counselor/Therapist Progress Note  Patient ID: Kim [name], MRN: [id number],    Date: 07/01/2020  Time Spent:  60 minutes   9:00am to 10:00am  Treatment Type: Individual Therapy  Reported Symptoms: anxiety   Mental Status Exam:  Appearance:   Casual     Behavior:  Appropriate, Sharing and Motivated  Motor:  Normal  Speech/Language:   Clear and Coherent  Affect:  anxious  Mood:  anxious  Thought process:  goal directed  Thought content:    WNL  Sensory/Perceptual disturbances:    WNL  Orientation:  oriented to person, place, time/date, situation, day of week, month of year and year  Attention:  Good  Concentration:  Good  Memory:  WNL  Fund of knowledge:   Good  Insight:    Good  Judgment:   Good  Impulse Control:  Good   Risk Assessment: Danger to Self:  No Self-injurious Behavior: No Danger to Others: No Duty to Warn:no Physical Aggression / Violence:No  Access to Firearms a concern: No  Gang Involvement:No   Subjective: Patient today reporting anxiety, seems some better at times. Making more efforts to manage it more effectively.  Interventions: Solution-Oriented/Positive Psychology and Ego-Supportive  Diagnosis:   ICD-10-CM   1. Generalized anxiety disorder  F41.1      Plan of Care: Patient not signing treatment plan on computer screen due to Covid.  Treatment Goal Plan: Goals remain on treatment plan as patient works with strategies to achieve her goals. Progress will be documented each session and noted in "Progress" section of treatment plan.  Long term goal: Reduce overall level, frequency, and intensity of the anxiety so that daily functioning is not impaired.  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   Short term goal: Identify, challenge, and replace anxious/fearful self-talk with positive, reality based, and empowering self talk.  Strategies: Teach the  patientto implement "thought stopping" techniques for anxieties and worries that have been addressed but persist.  Progress: Patient in today reporting anxiety which she feels is better at times.  Improved self-talk has helped some.  Brought in some writing she did as homework since last session, about anxious thoughts since last session and we processed these today.  Patient able to see more clearly her need to more quickly interrupt her anxious thoughts. "My schedule also stresses me out as it's always overwhelming."  Also having some issues that are "work and friend related". Discussed several situations that are work-related and she is having a difficult time handling certain interactions and has trouble letting go and re-focusing on other things. "I recognize I make things up in my head" and "I recognize it's toxic" and I end up anticipating something that may never happen. Working harder to be more proactive and do feel that I'm recognizing my destructive thought patterns more quickly, in order to replace them with more reality-based, empowering thoughts/self-talk that do not lead to anxiety, per tx goal plan above. Needed to also discuss today some concerns with relationship with mother who lives about 10 minutes away from patient.  Patient acknowledging she needs better boundaries with mom, but hasn't been very successful much yet. States she needs to not get so"psyched up about things that give me so much anxiety", and that would require my Not thinking ahead and assume something negative is going to happen, which escalates my anxiety. Commits to practicing this between  sessions as she reports this happens often and is significant to her anxiety.  Encouraged patient and her improved self talk, her increase and setting some limits in situations and needs to build upon that, improving her sleep patterns, staying in contact with others who are supportive of her, healthy nutrition and exercise, stopping  self blame, getting outside some each day, enjoying healthy relationships with others and knowing which ones she needs to limit, stay in the present and focus on what she can control, and having healthy boundaries with others within family and work situations.  Goal review and progress/challenges noted with patient.  Next appointment within 2 to 3 weeks.   Shanon Ace, LCSW

## 2020-07-21 ENCOUNTER — Other Ambulatory Visit: Payer: Self-pay

## 2020-07-21 ENCOUNTER — Ambulatory Visit (INDEPENDENT_AMBULATORY_CARE_PROVIDER_SITE_OTHER): Payer: 59 | Admitting: Psychiatry

## 2020-07-21 DIAGNOSIS — F411 Generalized anxiety disorder: Secondary | ICD-10-CM

## 2020-07-21 NOTE — Progress Notes (Signed)
      [facility] Counselor/Therapist Progress Note  Patient ID: Kim [name], MRN: [id number],    Date: 07/21/2020  Time Spent: 60 minutes  11:00am to 12:00noon  Treatment Type: Individual Therapy  Reported Symptoms: anxiety, some discouragement re: work, stressed  Mental Status Exam:  Appearance:   Casual     Behavior:  Appropriate, Sharing and Motivated  Motor:  Normal  Speech/Language:   Clear and Coherent  Affect:  anxious, depressed  Mood:  anxious and depressed  Thought process:  goal directed  Thought content:    WNL  Sensory/Perceptual disturbances:    WNL  Orientation:  oriented to person, place, time/date, situation, day of week, month of year and year  Attention:  Good  Concentration:  Good  Memory:  WNL  Fund of knowledge:   Good  Insight:    Good and Fair  Judgment:   Good  Impulse Control:  Good   Risk Assessment: Danger to Self:  No Self-injurious Behavior: No Danger to Others: No Duty to Warn:no Physical Aggression / Violence:No  Access to Firearms a concern: No  Gang Involvement:No   Subjective: Patient today reporting anxiety, discouragement re: work, stressed   Interventions: Cognitive Behavioral Therapy and Solution-Oriented/Positive Psychology  Diagnosis:   ICD-10-CM   1. Generalized anxiety disorder  F41.1      Plan of Care: Patient not signing treatment plan on computer screen due to Covid.  Treatment Goal Plan: Goals remain on treatment plan as patient works with strategies to achieve her goals. Progress will be documented each session and noted in "Progress" section of treatment plan.  Long term goal: Reduce overall level, frequency, and intensity of the anxiety so that daily functioning is not impaired.  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   Short term goal: Identify, challenge, and replace anxious/fearful self-talk with positive, reality based, and  empowering self talk.  Strategies: Teach the patientto implement "thought stopping" techniques for anxieties and worries that have been addressed but persist.  Progress: Patient today reporting anxiety, discouragement , feeling stressed. Recent anxiety re: family issues.  Applied for a job with [employer] 2 weeks ago.  Has not heard anything yet.  Difficult conversation with mom processed which has led to some ongoing relationship and communication concerns with mom. Stressful/anxious but motivated to change.  Discussed ways she could better handle communication with mother who can be overbearing.  Also worked with some self-affirming strategies for patient to use as she is working on [employer] healthier changes.  Worked in session on specific frequent anxious/self negating thoughts and self talk, helping patient to interrupt and replace them with more affirming and reality based thoughts and self talk, per long and short term goals and strategies and treatment plan above.  Continue to work on boundaries with mom and others that can impact patient in unhealthy ways.  Patient still working on not assuming the negative versus the positive in situations.  Also encouraged her to work on boundary setting, staying in the present, focusing on what she can control versus not control, having better sleep patterns, and staying in contact with people who are supportive of her.   Goal review and progress/challenges noted with patient.  Next appointment within 2 to 3 weeks.   Shanon Ace, LCSW

## 2020-08-09 ENCOUNTER — Ambulatory Visit (INDEPENDENT_AMBULATORY_CARE_PROVIDER_SITE_OTHER): Payer: 59 | Admitting: Psychiatry

## 2020-08-09 ENCOUNTER — Other Ambulatory Visit: Payer: Self-pay

## 2020-08-09 DIAGNOSIS — F411 Generalized anxiety disorder: Secondary | ICD-10-CM

## 2020-08-09 NOTE — Progress Notes (Signed)
[facility] Counselor/Therapist Progress Note  Patient ID: Kim [name], MRN: [id number],    Date: 08/09/2020  Time Spent: 60 minutes   10:00am to 11:00am   Treatment Type: Individual Therapy  Reported Symptoms: anxiety, "worries a lot especially about mom"  Mental Status Exam:  Appearance:   Well Groomed     Behavior:  Appropriate, Sharing and Motivated  Motor:  Normal  Speech/Language:   Clear and Coherent  Affect:  anxious  Mood:  anxious  Thought process:  goal directed  Thought content:    some obsessiveness  Sensory/Perceptual disturbances:    WNL  Orientation:  oriented to person, place, time/date, situation, day of week, month of year and year  Attention:  Good  Concentration:  Good  Memory:  WNL  Fund of knowledge:   Good  Insight:    Good  Judgment:   Good  Impulse Control:  Good   Risk Assessment: Danger to Self:  No Self-injurious Behavior: No Danger to Others: No Duty to Warn:no Physical Aggression / Violence:No  Access to Firearms a concern: No  Gang Involvement:No   Subjective: Patient reports anxiety is main symptom and worries a lot about mom and some about dad, and "when something happens to them since I am single." Such feelings have gotten stronger more recently.  Interventions: Cognitive Behavioral Therapy and Solution-Oriented/Positive Psychology  Diagnosis:   ICD-10-CM   1. Generalized anxiety disorder  F41.1     Plan of Care: Patient not signing treatment plan on computer screen due to Covid.  Treatment Goal Plan: Goals remain on treatment plan as patient works with strategies to achieve her goals. Progress will be documented each session and noted in "Progress" section of treatment plan.  Long term goal: Reduce overall level, frequency, and intensity of the anxiety so that daily functioning is not impaired.  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   Short  term goal: Identify, challenge, and replace anxious/fearful self-talk with positive, reality based, and empowering self talk.  Strategies: Teach the patientto implement "thought stopping" techniques for anxieties and worries that have been addressed but persist.  Progress: Patient in today reporting anxiety as main symptom, mostly regarding personal and family situations. Anxiety peaking with some personal and issues with Mom. Mom mentioning more frequently that she is concerned about living on her own and telling patient not to every put her in a nursing home.  Other family situations and mom's living alone have increased mom's concerns and mom does tend to "be a worrier".  Work has had some changes including new supervisor who is new to patient and not a "friend" as in prior situation at work.  Used her treatment goal plan above to identify, challenge, and replace her anxious/fearful thoughts and self-talk (especially about work, her mother, and herself being alone) to be more reality-based and empowering thoughts and self-talk.  Very motivated to change and yet change can be challenging.  Did review some ways that she can communicate with mother better and yet also have some limits.  Encouraged to set limits at work also were needed and to use some self-affirming strategies discussed in previous sessions as she works on setting limits and making other healthy changes.  To continue her work on more positive self talk, having healthier boundaries with mom and with others, not assuming the negative in situations, staying in the present focusing on what she  can control, having better sleep patterns, remaining in touch with people who are supportive of her, and looking for more positives than negatives each day.   Goal review and progress/challenges noted with patient.  Next appointment within 2 to 3 weeks.   Shanon Ace, LCSW

## 2020-08-25 ENCOUNTER — Ambulatory Visit (INDEPENDENT_AMBULATORY_CARE_PROVIDER_SITE_OTHER): Payer: 59 | Admitting: Psychiatry

## 2020-08-25 ENCOUNTER — Other Ambulatory Visit: Payer: Self-pay

## 2020-08-25 DIAGNOSIS — F411 Generalized anxiety disorder: Secondary | ICD-10-CM

## 2020-08-25 NOTE — Progress Notes (Signed)
[facility] Counselor/Therapist Progress Note  Patient ID: KAMALI SAKATA, MRN: [id number],    Date: 08/25/2020  Time Spent: 60 minutes     9:00am to 10:00am   Treatment Type: Individual Therapy  Reported Symptoms: anxiety  Mental Status Exam:  Appearance:   Well Groomed     Behavior:  Appropriate, Sharing and Motivated  Motor:  Normal  Speech/Language:   Clear and Coherent  Affect:  anxious  Mood:  anxious  Thought process:  goal directed  Thought content:    WNL  Sensory/Perceptual disturbances:    WNL  Orientation:  oriented to person, place, time/date, situation, day of week, month of year and year  Attention:  Good  Concentration:  Good  Memory:  WNL  Fund of knowledge:   Good  Insight:    Good  Judgment:   Good  Impulse Control:  Good   Risk Assessment: Danger to Self:  No Self-injurious Behavior: No Danger to Others: No Duty to Warn:no Physical Aggression / Violence:No  Access to Firearms a concern: No  Gang Involvement:No   Subjective: Patient reports anxiety and is doing some better and anxiety has sometimes been more situational than "overall" anxiety.   Interventions: Cognitive Behavioral Therapy and Solution-Oriented/Positive Psychology  Diagnosis:   ICD-10-CM   1. Generalized anxiety disorder  F41.1      Plan of Care: Patient not signing treatment plan on computer screen due to Covid.  Treatment Goal Plan: Goals remain on treatment plan as patient works with strategies to achieve her goals. Progress will be documented each session and noted in "Progress" section of treatment plan.  Long term goal: Reduce overall level, frequency, and intensity of the anxiety so that daily functioning is not impaired.  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   Short term goal: Identify, challenge, and replace anxious/fearful self-talk with positive, reality based, and empowering self  talk.  Strategies: Teach the patientto implement "thought stopping" techniques for anxieties and worries that have been addressed but persist.  Progress: Patient to day reports anxiety as main symptom with some improvement.  Feels her anxiety more recently has sometime felt situational versus "overall". Considering a job changes and this is leading to increased stress and anxiety.  Patient processed her thoughts, feelings, and anxieties at length in session about possible job change, and we were able to come up with several coping strategies that she can use during this interim stressful time.  Weighing her options and looking at advantages vs disadvantages and seemed to be less anxious and more grounded by session end on this issue.  Also discussed communication issues with her mother and shares how she recently needed to set a boundary with mother which patient found helpful.  Has also begun to use some better communication strategies with mom that allow her to be respectful of mom and yet not controlled by mom.  Has gotten back into the gym some but working hours is limiting her.  Mom can be a "real worrier" which can sometimes be contagious to the people around her and that is not what patient is wanting.  Encouraged patient to continue contact with people who are supportive of her, to stay in the present and focus on what she can control, get outside some every day as weather permits, encourage her healthier boundaries with mother which will likely help her to enjoy their time together more, continue  going to the gym as she is able with her schedule, practice positive self talk, continue working on improved sleep patterns, intentionally look for more positives than negatives daily, and be more aware and feel good about the strength she is showing and working through issues and changes in order to move forward.   Goal review and progress/challenges noted with patient.  Next appt within 2-3  weeks.   Shanon Ace, LCSW

## 2020-09-15 ENCOUNTER — Ambulatory Visit (INDEPENDENT_AMBULATORY_CARE_PROVIDER_SITE_OTHER): Payer: 59 | Admitting: Psychiatry

## 2020-09-15 ENCOUNTER — Other Ambulatory Visit: Payer: Self-pay

## 2020-09-15 DIAGNOSIS — F411 Generalized anxiety disorder: Secondary | ICD-10-CM

## 2020-09-15 NOTE — Progress Notes (Signed)
[facility] Counselor/Therapist Progress Note  Patient ID: RASHAD OBEID, MRN: [id number],    Date: 09/15/2020  Time Spent: 60 minutes    10:00am to 11:00am   Treatment Type: Individual Therapy  Reported Symptoms: anxiety ("improving some")  Mental Status Exam:  Appearance:   Casual     Behavior:  Appropriate, Sharing and Motivated  Motor:  Normal  Speech/Language:   Clear and Coherent  Affect:  anxious  Mood:  anxious  Thought process:  normal  Thought content:    some obsessiveness  Sensory/Perceptual disturbances:    WNL  Orientation:  oriented to person, place, time/date, situation, day of week, month of year and year  Attention:  Good  Concentration:  Good  Memory:  WNL  Fund of knowledge:   Good  Insight:    Good  Judgment:   Good  Impulse Control:  Good   Risk Assessment: Danger to Self:  No Self-injurious Behavior: No Danger to Others: No Duty to Warn:no Physical Aggression / Violence:No  Access to Firearms a concern: No  Gang Involvement:No   Subjective: Patient reporting anxiety and is noticing improvement.More insight.  (See Progress Note below).  Interventions: Cognitive Behavioral Therapy and Solution-Oriented/Positive Psychology  Diagnosis:   ICD-10-CM   1. Generalized anxiety disorder  F41.1       Plan of Care: Patient not signing treatment plan on computer screen due to Covid.  Treatment Goal Plan: Goals remain on treatment plan as patient works with strategies to achieve her goals. Progress will be documented each session and noted in "Progress" section of treatment plan.  Long term goal: Reduce overall level, frequency, and intensity of the anxiety so that daily functioning is not impaired.  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   Short term goal: Identify, challenge, and replace anxious/fearful self-talk with positive, reality based, and empowering self  talk.  Strategies: Teach the patientto implement "thought stopping" techniques for anxieties and worries that have been addressed but persist.  Progress: Patient in today reporting anxiety and is noticing improvement. Also having some increased insights re: my anxiety and working with strategies previously discussed in sessions to try to manage the anxiety more effectively.  Gave some recent examples which we discussed in session, which seemed helpful to patient. Several anxiety episodes related to patient's relationship with mother which is challenging. Processed some relationship issues with her mom and ways to set limits "that don't feel harsh", and patient is realizing she needs better boundaries.  Overthinking often and worked on [employer] with patient and being able to use a strategy of "Stop and Reset".  Reviewed communication skills discussed last session that focused primarily on better communication with her mom.  Also focused on some more positive self-care and self talk for patient.  Encouraged her to get outside some every day and walk or exercise or go to gym as she has in the past, to stay in contact with people who are supportive of her, to stay in the present focusing on what she can control or change, intentionally look for more positives daily, use healthy boundaries as needed with others, allow for more sleep as needed, and to feel good about the strength she is showing to work through issues and make needed changes in the midst of challenging circumstances.  Goal review and progress/challenges noted with patient.  Next appointment within 2 to 3 weeks.  Shanon Ace, LCSW

## 2020-10-04 ENCOUNTER — Other Ambulatory Visit: Payer: Self-pay

## 2020-10-04 ENCOUNTER — Ambulatory Visit (INDEPENDENT_AMBULATORY_CARE_PROVIDER_SITE_OTHER): Payer: 59 | Admitting: Psychiatry

## 2020-10-04 DIAGNOSIS — F411 Generalized anxiety disorder: Secondary | ICD-10-CM | POA: Diagnosis not present

## 2020-10-04 NOTE — Progress Notes (Signed)
[facility] Counselor/Therapist Progress Note  Patient ID: Kim [name], MRN: [id number],    Date: 10/04/2020  Time Spent: 60 minutes   10:00am to 11:00am   Treatment Type: Individual Therapy  Reported Symptoms: Anxiety. Concerns about mom's health who was hospitalized with pneumonia-like symptoms  Mental Status Exam:  Appearance:   Casual     Behavior:  Appropriate, Sharing and Motivated  Motor:  Normal  Speech/Language:   Clear and Coherent  Affect:  anxiety  Mood:  anxious  Thought process:  goal directed  Thought content:    WNL  Sensory/Perceptual disturbances:    WNL  Orientation:  oriented to person, place, time/date, situation, day of week, month of year and year  Attention:  Good  Concentration:  Good and Fair  Memory:  WNL  Fund of knowledge:   Good  Insight:    Good  Judgment:   Good  Impulse Control:  Good   Risk Assessment: Danger to Self:  No Self-injurious Behavior: No Danger to Others: No Duty to Warn:no Physical Aggression / Violence:No  Access to Firearms a concern: No  Gang Involvement:No   Subjective: Patient today reporting anxiety and some specific concerns with mom's health concerns recently and being hospitalized.  See Progress note below.   Interventions: Cognitive Behavioral Therapy and Solution-Oriented/Positive Psychology  Diagnosis:   ICD-10-CM   1. Generalized anxiety disorder  F41.1      Plan of Care: Patient not signing treatment plan on computer screen due to Covid.  Treatment Goal Plan: Goals remain on treatment plan as patient works with strategies to achieve her goals. Progress will be documented each session and noted in "Progress" section of treatment plan.  Long term goal: Reduce overall level, frequency, and intensity of the anxiety so that daily functioning is not impaired.  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   Short term  goal: Identify, challenge, and replace anxious/fearful self-talk with positive, reality based, and empowering self talk.  Strategies: Teach the patientto implement "thought stopping" techniques for anxieties and worries that have been addressed but persist.  Progress: Patient in today reporting anxiety as he main symptom, mostly related to personal issues, work concerns and possible transition, and family situations. Past weekend was very "worried and anxious" about mom who had to be taken to hospital with pneumonia-like symptoms. Patient's anxiety spiked, assuming/fearing mother was declining more than patient realized. Needed session today to process what all occurred at hospital with mother, as well as her escalated concerns about mother, and her own concerns with work and in her relationship with mom. "Worrying much more than usual" and we discussed her worries and how they've escalated as well as"her working more to become a person of concern versus a worrier" and understanding that things can have a positive outcome or challenging outcome but when she is less worried, she will be able to handle more of what ever happens in situations.  Other anxieties, patient is tending to handle better, at least at times.  She is wanting to have more consistency in better anxiety management.  Still overthinking a good bit but is continuing to work at reducing it, with "stop and reset" strategy.  Encouraged patient to practice more positive self-care and self talk, to stay in contact with people that are supportive of her, to get outside daily and walk or do some form of exercise, to stay in the  present focusing on what she can control, to set and keep healthy boundaries, intentionally look for more positives daily, work to keep a more reasonable sleep pattern, and to recognize the strength and resilience she is showing and trying to make needed changes, with the help of her treatment goal plan above, as she  confronts difficult situations as she is trying to move forward in a more positive direction.  Goal review and progress/challenges noted with patient.  Next appointment within 2 to 3 weeks.  Shanon Ace, LCSW

## 2020-10-25 ENCOUNTER — Ambulatory Visit: Payer: 59 | Admitting: Psychiatry

## 2020-11-02 ENCOUNTER — Other Ambulatory Visit: Payer: Self-pay

## 2020-11-02 ENCOUNTER — Ambulatory Visit (INDEPENDENT_AMBULATORY_CARE_PROVIDER_SITE_OTHER): Payer: 59 | Admitting: Psychiatry

## 2020-11-02 DIAGNOSIS — F411 Generalized anxiety disorder: Secondary | ICD-10-CM

## 2020-11-02 NOTE — Progress Notes (Signed)
[facility] Counselor/Therapist Progress Note  Patient ID: Kim [name], MRN: [id number],    Date: 11/02/2020  Time Spent: 60 minutes  Treatment Type: Individual Therapy  Reported Symptoms: Anxiety  Mental Status Exam:  Appearance:   Neat     Behavior:  Appropriate, Sharing and Motivated  Motor:  Normal  Speech/Language:   Clear and Coherent  Affect:  anxious  Mood:  anxious  Thought process:  goal directed  Thought content:    WNL  Sensory/Perceptual disturbances:    WNL  Orientation:  oriented to person, place, time/date, situation, day of week, month of year and year  Attention:  Good  Concentration:  Good  Memory:  WNL  Fund of knowledge:   Good  Insight:    Good  Judgment:   Good  Impulse Control:  Good   Risk Assessment: Danger to Self:  No Self-injurious Behavior: No Danger to Others: No Duty to Warn:no Physical Aggression / Violence:No  Access to Firearms a concern: No  Gang Involvement:No   Subjective:  Patient in today reporting anxiety as her main symptom, and is mostly related to personal concerns, her mom's health, and an upcoming job change, and trip to [place] with non-profit group. Anxiety is peaking some due to these stressors.  Worked with patient using her long and short term goals and strategy in tx plan below to decrease her anxious/worrisome thoughts and self-talk and help her in replacing those those thoughts and self-talk with more positive and empowering thoughts/self-talk that do not feed her anxiety and stress. Worked with specific anxious thoughts with which she struggles to interrupt, which was helpful to patient and seemed more grounded by session end, feeling some more self-assured. Some improvement in her "general worrying" and continues to work on this with relaxation/breathing exercises, "distractions", and "understanding some of it is related to hormonal symptoms of hot flashes."  Worrying less about her mother as her health  situation seems to have stabilized some.  Patient exhibits good determination, effort, and motivation leading to some symptom relief.  Is desiring to be more consistent and better anxiety management with less overthinking, and is definitely working to achieve this.  Still some time using the "stop and reset strategy" and is starting to see some of her progress more clearly.  Is to be taken on a new job this fall and looking forward to that, hoping to be feeling less anxious overall and more emotionally grounded.    Interventions: Solution-Oriented/Positive Psychology, Ego-Supportive and Insight-Oriented  Diagnosis:   ICD-10-CM   1. Generalized anxiety disorder  F41.1       Treatment goal plan: Patient not signing treatment plan on computer screen due to Covid. Treatment Goal Plan: Goals remain on treatment plan as patient works with strategies to achieve her goals. Progress will be documented each session and noted in "Progress" section of treatment plan. Long term goal: Reduce overall level, frequency, and intensity of the anxiety so that daily functioning is not impaired.  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  Short term goal: Identify, challenge, and replace anxious/fearful self-talk with positive, reality based, and empowering self talk. Strategies: Teach the patientto implement "thought stopping" techniques for anxieties and worries that have been addressed but persist.   Progress / Plan:  Patient remaining motivated and shows good effort in working on her treatment goals.  Encouraged her to practice more consistently positive self  talk and self-care, to set and keep healthy boundaries with others as needed, intentionally look for more positives daily, stay in contact with people that are supportive of her, get outside daily and walk or participate in other forms of exercise, allow for a healthy sleep pattern, and to feel  good about the strength she is showing and working on goal-directed behaviors as she is showing some forward movement in a positive direction.  Goal review and progress/challenges noted with patient.  Next appointment within 3 to 4 weeks, depending on her travel time with a nonprofit group trip.   Shanon Ace, LCSW

## 2020-11-17 ENCOUNTER — Ambulatory Visit: Payer: 59 | Admitting: Psychiatry

## 2020-12-17 ENCOUNTER — Other Ambulatory Visit: Payer: Self-pay

## 2020-12-17 ENCOUNTER — Ambulatory Visit (INDEPENDENT_AMBULATORY_CARE_PROVIDER_SITE_OTHER): Payer: 59 | Admitting: Psychiatry

## 2020-12-17 DIAGNOSIS — F411 Generalized anxiety disorder: Secondary | ICD-10-CM | POA: Diagnosis not present

## 2020-12-17 NOTE — Progress Notes (Signed)
[facility] Counselor/Therapist Progress Note  Patient ID: Kim [name], MRN: [id number],    Date: 12/17/2020  Time Spent: 58 minutes   Treatment Type: Individual Therapy  Reported Symptoms: anxiety, some tearfulness, reports no depression  Mental Status Exam:  Appearance:   Casual and Neat     Behavior:  Appropriate, Sharing, and Motivated  Motor:  Normal  Speech/Language:   Clear and Coherent  Affect:  anxious  Mood:  anxious  Thought process:  goal directed  Thought content:    WNL  Sensory/Perceptual disturbances:    WNL  Orientation:  oriented to person, place, time/date, situation, day of week, month of year, year, and stated date of December 17, 2020  Attention:  Good  Concentration:  Good  Memory:  WNL  Fund of knowledge:   Good  Insight:    Good  Judgment:   Good  Impulse Control:  Good   Risk Assessment: Danger to Self:  No Self-injurious Behavior: No Danger to Others: No Duty to Warn:no Physical Aggression / Violence:No  Access to Firearms a concern: No  Gang Involvement:No   Subjective:  Patient in today reporting anxiety regarding mother's health due to recent issues but she is improving at this point.  Patient's anxiety  is also due to personal and work issues/changes which she processed in detail in session. Also complicated with lots of challenges and some stressful/unexpected experiences while on her recent vacation which she tearfully talked through today and was able to use some CBT strategies connecting her thoughts to her feelings, and also normalizing some of her painful/unexpected feelings. Feeling less stressed and realizing more of her strength and insight. More calm and grounded by session end. Continued decrease in her more generalized worrying. Shows good determination and efforts. Overthinking decreasing. "Stop and reset" strategy helpful. 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    Interventions: Cognitive  Behavioral Therapy and Insight-Oriented  Diagnosis:   ICD-10-CM   1. Generalized anxiety disorder  F41.1       Treatment goal plan: Patient not signing treatment plan on computer screen due to Covid. Treatment Goal Plan: Goals remain on treatment plan as patient works with strategies to achieve her goals.  Progress will be documented each session and noted in "Progress" section of treatment plan. Long term goal: Reduce overall level, frequency, and intensity of the anxiety so that daily functioning is not impaired. 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 Short term goal: Identify, challenge, and replace anxious/fearful self-talk with positive, reality based, and empowering self talk.  Strategies: Teach the patient to implement "thought stopping" techniques for anxieties and worries that have been addressed but persist    Plan:  Patient today showing good motivation today in spite of having a difficult time recently with mother's health issues and some personal experiences which she discussed in session as noted above.  By the end of session, she was more calm and seemed to have more renewed confidence in herself.  Showing good efforts in working on treatment goals.  Encouraged her to use some behaviors that have proven to be helpful before in between sessions including: Consistently practice positive self talk, stay in contact with people that are supportive, get outside daily and walk, set and keep healthy boundaries with others as needed, intentionally look for more positives daily, allow for a healthy sleep pattern, stay in  the present focusing on what she can control, look for positives within herself, and recognize the strengths she is showing as she works with goal-directed behaviors on moving forward in a more positive direction of improved emotional health.  Goal review and progress/challenges noted with patient.  Next  appt within 3 weeks.   Shanon Ace, LCSW

## 2021-01-10 ENCOUNTER — Ambulatory Visit: Payer: 59 | Admitting: Psychiatry

## 2021-01-10 ENCOUNTER — Other Ambulatory Visit: Payer: Self-pay

## 2021-01-10 DIAGNOSIS — F411 Generalized anxiety disorder: Secondary | ICD-10-CM | POA: Diagnosis not present

## 2021-01-10 NOTE — Progress Notes (Signed)
[facility] Counselor/Therapist Progress Note  Patient ID: Kim [name], MRN: [id number],    Date: 01/10/2021  Time Spent: 58 minutes   Treatment Type: Individual Therapy  Reported Symptoms: anxiety  Mental Status Exam:  Appearance:   Casual and Neat     Behavior:  Appropriate, Sharing, and Motivated  Motor:  Normal  Speech/Language:   Clear and Coherent  Affect:  anxious  Mood:  anxious  Thought process:  goal directed  Thought content:    WNL  Sensory/Perceptual disturbances:    WNL  Orientation:  oriented to person, place, time/date, situation, day of week, month of year, year, and stated date of Aug. 1, 2022  Attention:  Good  Concentration:  Good  Memory:  WNL  Fund of knowledge:   Good  Insight:    Good  Judgment:   Good  Impulse Control:  Good   Risk Assessment: Danger to Self:  No Self-injurious Behavior: No Danger to Others: No Duty to Warn:no Physical Aggression / Violence:No  Access to Firearms a concern: No  Gang Involvement:No   Subjective:  Patient in today reporting anxiety re: family and new job starting Aug. 17th. Mom's health improved. Having more anxious/fearful thoughts re: new job starting soon, relationship, and family that is heavily impacting patient. Reviewed some of the frequent more anxious/fearful thoughts and focused on them today, working to interrupt them more quickly and replace with more reality-based and empowering thoughts per short term goal and strategy in tx goal plan. Encouraged to use CBT strategies to help in recognizing the anxious/fearful thoughts sooner and more consistently and understand the connection better between her thoughts and feelings. Calmer and more grounded, feeling more confident as to how she has handles sensitive situations, by session end. Processed some of her stress about new job starting soon, some overthinking and encouraged to use "stop  and reset" strategy.    Interventions: Cognitive Behavioral  Therapy and Solution-Oriented/Positive Psychology  Diagnosis:   ICD-10-CM   1. Generalized anxiety disorder  F41.1       Treatment goal plan: Patient not signing treatment plan on computer screen due to Covid. Treatment Goal Plan: Goals remain on treatment plan as patient works with strategies to achieve her goals.  Progress will be documented each session and noted in "Progress" section of treatment plan. Long term goal: Reduce overall level, frequency, and intensity of the anxiety so that daily functioning is not impaired. 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 Short term goal: Identify, challenge, and replace anxious/fearful self-talk with positive, reality based, and empowering self talk.  Strategies: Teach the patient to implement "thought stopping" techniques for anxieties and worries that have been addressed but persist     Plan:  Patient today showing good motivation in working on her anxiety in reference to family and friend issues as well as upcoming new job starting this month. Will be working more with short term goal and strategy from tx goal plan between sessions. Encouraged patient to use some behaviors that have proven to be helpful previously including: Intentionally look for more positives daily, allow for healthy sleep pattern, practice positive self talk, stay in contact with people that are supportive of her, get outside daily and walk, look for positives within herself, set and keep healthy boundaries with others as needed, stay in the present focusing on what she can control, and feel good about the strength  she is showing as she works with goal-directed behaviors on moving forward in a more positive direction of improved emotional health.  Review and progress/challenges noted with patient.  Next appointment within 2 to 3 weeks.  Shanon Ace, LCSW

## 2021-02-08 ENCOUNTER — Other Ambulatory Visit: Payer: Self-pay

## 2021-02-08 ENCOUNTER — Ambulatory Visit (INDEPENDENT_AMBULATORY_CARE_PROVIDER_SITE_OTHER): Payer: 59 | Admitting: Psychiatry

## 2021-02-08 DIAGNOSIS — F411 Generalized anxiety disorder: Secondary | ICD-10-CM | POA: Diagnosis not present

## 2021-02-08 NOTE — Progress Notes (Signed)
[facility] Counselor/Therapist Progress Note  Patient ID: Kim [name], MRN: [id number],    Date: 02/08/2021  Time Spent: 58 minutes   Treatment Type: Individual Therapy  Reported Symptoms: anxiety  Mental Status Exam:  Appearance:   Well Groomed     Behavior:  Appropriate, Sharing, and Motivated  Motor:  Normal  Speech/Language:   Clear and Coherent and Normal Rate  Affect:  anxious  Mood:  anxious  Thought process:  goal directed  Thought content:    WNL  Sensory/Perceptual disturbances:    WNL  Orientation:  oriented to person, place, time/date, situation, day of week, month of year, year, and stated date of Aug. 30, 2022  Attention:  Good  Concentration:  Good  Memory:  WNL  Fund of knowledge:   Good  Insight:    Good  Judgment:   Good  Impulse Control:  Good   Risk Assessment: Danger to Self:  No Self-injurious Behavior: No Danger to Others: No Duty to Warn:no Physical Aggression / Violence:No  Access to Firearms a concern: No  Gang Involvement:No   Subjective: Patient in today reporting anxiety as main symptom, and is improving some. Started new job within past week in [employer]. Anxious/stressed but feels she will like the job. Processes today her anxiety she has experienced in starting new job and personal issues. Able to name her anxieties and fearful thoughts and process them in session today. Talked through these concerns and realized some of the fears were most likely not to happen and were probably more related to her being new and not knowing anyone at her schools, and not knowing what to expect. 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 Discussed some positive coping skills with patient including pacing herself, taking breaks as she can, reach out to others for help as needed, pace herself, trying to interrupt her tendency to over think, use the "stop and reset" strategy,  and positive self talk. Mom's health improving. Dad had recent heart issue at hospital but better now  and patient more relieved. More grounded and calm by end of session.  Interventions: Cognitive Behavioral Therapy, Solution-Oriented/Positive Psychology, and Ego-Supportive  Diagnosis:   ICD-10-CM   1. Generalized anxiety disorder  F41.1        Treatment goal plan: Patient not signing treatment plan on computer screen due to Covid. Treatment Goal Plan: Goals remain on treatment plan as patient works with strategies to achieve her goals.  Progress will be documented each session and noted in "Progress" section of treatment plan. Long term goal: Reduce overall level, frequency, and intensity of the anxiety so that daily functioning is not impaired. 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 Short term goal: Identify, challenge, and replace anxious/fearful self-talk with positive, reality based, and empowering self talk.  Strategies: Teach the patient to implement "thought stopping" techniques for anxieties and worries that have been addressed but persist    Plan: Patient today showing really good motivation and engagement in session today.  Worked on her anxiety regarding her new job within the school system as well as some personal/family issues.  Also shows good resilience and some of the stressful situations she has been in more recently.  Encouraged her to practice positive behaviors that have proven to be helpful previously including: Allow for healthy sleep pattern, practicing positive self talk,  intentionally looking for more positives than negatives daily, stay in contact with people that are supportive, get outside daily and walk, set and keep healthy boundaries with others as needed, stay in the present focusing on what she can control, look for the positives within herself, and recognize the strength that she is showing as she  works with goal-directed behaviors in trying to move forward in a more positive direction of improved emotional health.  Goal review and progress/challenges noted with patient.  Next appointment within 3 weeks.   Shanon Ace, LCSW

## 2021-03-08 ENCOUNTER — Ambulatory Visit (INDEPENDENT_AMBULATORY_CARE_PROVIDER_SITE_OTHER): Payer: BC Managed Care – PPO | Admitting: Psychiatry

## 2021-03-08 ENCOUNTER — Other Ambulatory Visit: Payer: Self-pay

## 2021-03-08 DIAGNOSIS — F411 Generalized anxiety disorder: Secondary | ICD-10-CM | POA: Diagnosis not present

## 2021-03-08 NOTE — Progress Notes (Signed)
[facility] Counselor/Therapist Progress Note  Patient ID: Kim [name], MRN: [id number],    Date: 03/08/2021  Time Spent: 50 minutes   Treatment Type: Individual Therapy  Reported Symptoms: anxiety  Mental Status Exam:  Appearance:   Well Groomed     Behavior:  Appropriate, Sharing, and Motivated  Motor:  Normal  Speech/Language:   Clear and Coherent  Affect:  anxious  Mood:  anxious  Thought process:  goal directed  Thought content:    Some overthinking  Sensory/Perceptual disturbances:    WNL  Orientation:  oriented to person, place, time/date, situation, day of week, month of year, year, and stated date of Sept 27, 2022  Attention:  Good  Concentration:  Good  Memory:  WNL  Fund of knowledge:   Good  Insight:    Good  Judgment:   Good  Impulse Control:  Good   Risk Assessment: Danger to Self:  No Self-injurious Behavior: No Danger to Others: No Duty to Warn:no Physical Aggression / Violence:No  Access to Firearms a concern: No  Gang Involvement:No   Subjective:    Patient in today reporting heightened anxiety personally and professionally, especially in her new job and is liking the job, "just lots to learn".  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 Some recent outreach from prior job that concerned patient and she needed to talk through the stress and feeling judged issues from most recent job. Also today processed some challenges in new job and is "learning the ropes" and already dealing with student situations, including a very tragic situation with a 5th grade student, which was difficult but felt she handled her supportive role well.  Reviewed some positive coping skills with patient and encouraged her to take breaks as she is able, reach out to others for help as needed, pace herself, and realize that there is a learning curve when you take a new job and to be accepting of the fact there is a lot for her to learn and  exercise patients with that.  Interventions: Cognitive Behavioral Therapy, Solution-Oriented/Positive Psychology, and Ego-Supportive  Diagnosis:   ICD-10-CM   1. Generalized anxiety disorder  F41.1       Treatment goal plan: Patient not signing treatment plan on computer screen due to Covid. Treatment Goal Plan: Goals remain on treatment plan as patient works with strategies to achieve her goals.  Progress will be documented each session and noted in "Progress" section of treatment plan. Long term goal: Reduce overall level, frequency, and intensity of the anxiety so that daily functioning is not impaired. 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 Short term goal: Identify, challenge, and replace anxious/fearful self-talk with positive, reality based, and empowering self talk.  Strategies: Teach the patient to implement "thought stopping" techniques for anxieties and worries that have been addressed but persist    Plan:  Patient today showing good motivation and participation in session today worked more on her anxiety as she has begun her new job and shared some of the situations in which she has already been involved in the things that she has identified as being a "learning curve" for her.  Her family situation with evolving health issues has calmed down.  Anxiety is still somewhat high, some in reference to starting her new job.  Continues to show good resilience in bouncing back through some difficult situations and difficult communication  on certain issues.  (Not all details included in this note due to patient privacy needs.)  Encouraged patient to practice positive behaviors that can be helpful to her including: Practicing positive self talk consistently, intentionally looking for more positives and negatives daily, staying in contact with people that are supportive, get outside daily and walk, set and keep healthy boundaries  with others as needed, allow healthy sleep pattern, stay in the present focusing on what she can control, looking for the positives within herself, intentionally look for more positives and negatives every day, and realize the strength she is showing as she works with goal-directed behaviors to move forward in a direction that supports improved emotional health.  Goal review and progress/challenges noted with patient.    Next appointment within 3 weeks.  This record has been created using [employer].  Chart creation errors have been sought, but may not always have been located and corrected.  Such creation errors do not reflect on the standard of medical care provided.    Shanon Ace, LCSW

## 2021-04-05 ENCOUNTER — Ambulatory Visit: Payer: 59 | Admitting: Psychiatry

## 2021-05-09 ENCOUNTER — Ambulatory Visit (INDEPENDENT_AMBULATORY_CARE_PROVIDER_SITE_OTHER): Payer: BC Managed Care – PPO | Admitting: Psychiatry

## 2021-05-09 ENCOUNTER — Other Ambulatory Visit: Payer: Self-pay

## 2021-05-09 DIAGNOSIS — F411 Generalized anxiety disorder: Secondary | ICD-10-CM

## 2021-05-09 NOTE — Progress Notes (Signed)
[facility] Counselor/Therapist Progress Note  Patient ID: Kim [name], MRN: [id number],    Date: 05/09/2021  Time Spent: 58 minutes   Treatment Type: Individual Therapy  Reported Symptoms: anxiety, stressed, overthinking  Mental Status Exam:  Appearance:   Casual     Behavior:  Appropriate, Sharing, and Motivated  Motor:  Normal  Speech/Language:   Clear and Coherent  Affect:  anxious  Mood:  anxious  Thought process:  goal directed  Thought content:    WNL  Sensory/Perceptual disturbances:    WNL  Orientation:  oriented to person, place, time/date, situation, day of week, month of year, year, and stated date of Nov. 28, 2022  Attention:  Good  Concentration:  Good  Memory:  WNL  Fund of knowledge:   Good  Insight:    Good  Judgment:   Good  Impulse Control:  Good   Risk Assessment: Danger to Self:  No Self-injurious Behavior: No Danger to Others: No Duty to Warn:no Physical Aggression / Violence:No  Access to Firearms a concern: No  Gang Involvement:No   Subjective:   Patient in today reporting anxiety, overthinking, and stressed personally and at work. Is liking her new job. Overthinking a lot on her job and is working to calm that down some and trust herself more. Has had more challenges at work and feeling she is learning more in her role. States her anxiety at work, although strong right now, is gradually decreasing. Spent Thanksgiving with her mom which led to frustration, anger, and hurt. Patient processed the interactions with mom that day as well as the anger, frustration, and the hurt patient felt as a result. Mom continued to "give me the silent treatment" which was uncomfortable, but eventually able to let go of her hurt and anger, "rather than hang onto the negative feelings."  Interventions: Solution-Oriented/Positive Psychology, Ego-Supportive, and Insight-Oriented   Treatment goal plan: Patient not signing treatment plan on computer screen  due to Covid. Treatment Goal Plan: Goals remain on treatment plan as patient works with strategies to achieve her goals.  Progress will be documented each session and noted in "Progress" section of treatment plan. Long term goal: Reduce overall level, frequency, and intensity of the anxiety so that daily functioning is not impaired. 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 Short term goal: Identify, challenge, and replace anxious/fearful self-talk with positive, reality based, and empowering self talk.  Strategies: Teach the patient to implement "thought stopping" techniques for anxieties and worries that have been addressed but persist    Diagnosis:   ICD-10-CM   1. Generalized anxiety disorder  F41.1       Plan:  Patient today showing good motivation and engagement in session today as she worked on some continued hurt issues with mom as there was a "recent occurrence" with mom on Thanksgiving Day, that led to patient feeling frustrated, angry, and hurt, all of which we processed in session, as noted above. Anxiety with new job has decreased some, and she is happy with her job.  Still showing good resilience through difficult situations especially within the family.  (Not all details included in this note due to patient privacy needs.)  Encouraged her in her practice of positive behaviors including: Positive self talk, intentionally looking for more positives than negatives daily, staying in contact with people that are supportive, getting outside daily and walking, setting and keeping healthy boundaries  with others as needed, allowing for healthy sleep patterns, staying in the present focusing on what she can control, searching for the positives within herself, believing in herself more, and feel good about the strength she shows working with goal-directed behaviors to move forward in a direction that supports improved emotional health  and overall wellbeing.  Goal review and progress/challenges noted with patient.  Next appointment within 3 weeks.  This record has been created using [employer].  Chart creation errors have been sought, but may not always have been located and corrected.  Such creation errors do not reflect on the standard of medical care provided.    Shanon Ace, LCSW

## 2021-06-27 ENCOUNTER — Other Ambulatory Visit: Payer: Self-pay

## 2021-06-27 ENCOUNTER — Ambulatory Visit (INDEPENDENT_AMBULATORY_CARE_PROVIDER_SITE_OTHER): Payer: BC Managed Care – PPO | Admitting: Psychiatry

## 2021-06-27 DIAGNOSIS — F411 Generalized anxiety disorder: Secondary | ICD-10-CM | POA: Diagnosis not present

## 2021-06-27 NOTE — Progress Notes (Signed)
[facility] Counselor/Therapist Progress Note  Patient ID: LAROSE BATRES, MRN: [id number],    Date: 06/27/2021  Time Spent: 55 minutes   Treatment Type: Individual Therapy  Reported Symptoms: anxiety, sadness re: some personal and family issues  Mental Status Exam:  Appearance:   Casual     Behavior:  Appropriate, Sharing, and Motivated  Motor:  Normal  Speech/Language:   Clear and Coherent  Affect:  anxious  Mood:  anxious  Thought process:  goal directed  Thought content:    Some overthinking and some obsessiveness  Sensory/Perceptual disturbances:    WNL  Orientation:  oriented to person, place, time/date, situation, day of week, month of year, year, and stated date of Jan. 16, 2023  Attention:  Good  Concentration:  Good  Memory:  WNL  Fund of knowledge:   Good  Insight:    Good  Judgment:   Good  Impulse Control:  Good   Risk Assessment: Danger to Self:  No Self-injurious Behavior: No Danger to Others: No Duty to Warn:no Physical Aggression / Violence:No  Access to Firearms a concern: No  Gang Involvement:No   Subjective: Patient in today reporting anxiety, some overthinking, and experiencing stressors personally and at work. Upset from the start re: a guy she was in youth group years ago when they were growing up. Luvenia Heller  has gotten into some serious difficulty legally and patient finding it hard to believe and processing lots of mixed feelings due to the circumstances involved. Has not been close to the guy since teen/young adult years. "Just finding it so hard to believe" but in talking through her concerns today , she became calmer and more grounded, and able focus more on her original stressors of family concerns/relationships. Still a significant support person for her uncle in nursing facility which can be challenging . Newer job is stressful and she seems to be settling in although "has lots of things to learn."  Challenges in helping elderly mom processed  including boundaries for herself. Overthinking personally and on the job and worked with interrupting it and trusting herself more in decision-making and boundary setting.  Challenges at work and challenges in helping support her aging mother and trying to balance them better in addition to having better self-care.  Focusing more on letting go of hurt, anger, disappointment rather than holding onto negative feelings that feed her anxiety or in some cases, sadness.  Interventions: Solution-Oriented/Positive Psychology, Ego-Supportive, and Insight-Oriented   Treatment goal plan: Patient not signing treatment plan on computer screen due to Covid. Treatment Goal Plan: Goals remain on treatment plan as patient works with strategies to achieve her goals.  Progress will be documented each session and noted in "Progress" section of treatment plan. Long term goal: Reduce overall level, frequency, and intensity of the anxiety so that daily functioning is not impaired. 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 Short term goal: Identify, challenge, and replace anxious/fearful self-talk with positive, reality based, and empowering self talk.  Strategies: Teach the patient to implement "thought stopping" techniques for anxieties and worries that have been addressed but persist Diagnosis:   ICD-10-CM   1. Generalized anxiety disorder  F41.1      Plan:  Patient today showing good motivation and participated well in session today focusing on her anxiety/stress and continuing hurtful issues with mother. "I find myself on an emotional roller coaster with my mom and  family concerns. Tending to assume worst case scenarios and beginning to catch herself some and interrupt those negative thought cycles. Pressured by her elderly mother and needing to set limits, which we spoke about in session today and worked with specific examples which was helpful.  Good  resilience in spite of difficult circumstances.  Encouraged patient in the practice of positive behavior including: Reflecting on her progress more often, positive self talk, intentionally looking for more positives daily, staying in contact with people that are supportive, getting outside daily and walking, setting and keeping healthy boundaries with others, allowing for healthy sleep patterns, staying in the present focusing on what she can control, searching for the positives within herself, believing in herself more in her ability to make tough changes, for every negative thought create 2 positives, and recognize the strength she shows working with goal-directed behaviors to move forward in a direction that supports improved emotional health.  Goal review and progress/challenges noted with patient.  Next appointment within 3 to 4 weeks.  This record has been created using [employer].  Chart creation errors have been sought, but may not always have been located and corrected.  Such creation errors do not reflect on the standard of medical care provided.   Shanon Ace, LCSW

## 2021-07-25 ENCOUNTER — Ambulatory Visit (INDEPENDENT_AMBULATORY_CARE_PROVIDER_SITE_OTHER): Payer: BC Managed Care – PPO | Admitting: Psychiatry

## 2021-07-25 ENCOUNTER — Other Ambulatory Visit: Payer: Self-pay

## 2021-07-25 DIAGNOSIS — F411 Generalized anxiety disorder: Secondary | ICD-10-CM

## 2021-07-25 NOTE — Progress Notes (Signed)
[facility] Counselor/Therapist Progress Note  Patient ID: Kim [name], MRN: [id number],    Date: 07/25/2021  Time Spent: 57 minutes   Treatment Type: Individual Therapy  Reported Symptoms: anxiety, stressed   Mental Status Exam:  Appearance:   Casual     Behavior:  Appropriate, Sharing, and Motivated  Motor:  Normal  Speech/Language:   Clear and Coherent  Affect:  anxious  Mood:  anxious  Thought process:  goal directed  Thought content:    WNL  Sensory/Perceptual disturbances:    WNL  Orientation:  oriented to person, place, time/date, situation, day of week, month of year, year, and stated date of Feb. 13, 2023  Attention:  Good  Concentration:  Good  Memory:  WNL  Fund of knowledge:   Good  Insight:    Good  Judgment:   Good  Impulse Control:  Good   Risk Assessment: Danger to Self:  No Self-injurious Behavior: No Danger to Others: No Duty to Warn:no Physical Aggression / Violence:No  Access to Firearms a concern: No  Gang Involvement:No   Subjective:  Patient in today reporting anxiety, making progress in handling frustration/overthinking, and in setting limits with others as needed. Doing better with her fearful thoughts she worked on last session involving guy she knew growing up and he had killed someone; patient had been struggling with a lot of disbelief/shock/and fears but feels she's moved beyond that at this point. Expressing sadness in learning that her supervisor is leaving to take a job in [employer] system ad talked through this today as that person has been a real support to patient being new to her job this year.  Also worked further on her issues with her aging mother and patient senses some improvement and better boundary setting as needed. Worked more today on some of her overthinking, being able to interrupt it and be more trusting of herself especially in decision-making. Continues working to let go of anger, disappointment, hurt, and  letting go of negative thoughts/feelings that do worsen her anxiety. Focused on better life balance, considering her job, her church involvement, and helping her elderly mother with good boundaries.   Interventions: Solution-Oriented/Positive Psychology and Insight-Oriented  Treatment goal plan: Patient not signing treatment plan on computer screen due to Covid. Treatment Goal Plan: Goals remain on treatment plan as patient works with strategies to achieve her goals.  Progress will be documented each session and noted in "Progress" section of treatment plan. Long term goal: Reduce overall level, frequency, and intensity of the anxiety so that daily functioning is not impaired. 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 Short term goal: Identify, challenge, and replace anxious/fearful self-talk with positive, reality based, and empowering self talk.  Strategies: Teach the patient to implement "thought stopping" techniques for anxieties and worries that have been addressed but persist  Diagnosis:   ICD-10-CM   1. Generalized anxiety disorder  F41.1      Plan:  Patient today showing good motivation and interacted well in session today as she worked on her anxiety around the issues involving her new job, losing her supervisor, some recent issues with her best friend of several years who lives in [place], and relationship issues with aging mom. Anxiety and depression described as 50/50 as far as symptoms. Processed her thoughts and feelings as well as interactions involved with the situations above that seemed to help patient clarify  some of her feelings and why they feel so strong right now.  Acknowledges that she is doing well at work for the most part and is still learning her job there.  Admits that when several things are going on in her personal life and work life it is easy for the anxiety/stress to make her feel like she is on a roller  coaster of emotions.  Talked about some ways she can better manage this when it happens and she is on the job and patient responded well.  Working to let go of some tendencies to be negative in her thoughts and makes an accurate assumptions.  More sensitive on these type of situations when it involves close friends or relationships.  More calm and grounded by the end of session and feeling more confident in herself. Encouraged patient and her trying to practice positive behaviors including: Recognizing and reflecting on her progress often, positive self talk, intentionally looking for more positives daily, staying in contact with other people that are supportive, getting outside daily and walking, setting and keeping healthy boundaries with others, allowing for healthy sleep patterns, staying in the present focusing on what she can control, searching for the positives within herself, believing in herself more and her ability to make tough changes, for every negative thought create 2 positives, and realize her strength as she works with goal-directed behaviors to move forward in a direction that supports improved emotional health.  Review and progress/challenges noted with patient.  Next appointment within 3 to 4 weeks.  This record has been created using [employer].  Chart creation errors have been sought, but may not always have been located and corrected.  Such creation errors do not reflect on the standard of medical care provided.   Shanon Ace, LCSW

## 2021-09-06 ENCOUNTER — Ambulatory Visit (INDEPENDENT_AMBULATORY_CARE_PROVIDER_SITE_OTHER): Payer: BC Managed Care – PPO | Admitting: Psychiatry

## 2021-09-06 ENCOUNTER — Other Ambulatory Visit: Payer: Self-pay

## 2021-09-06 DIAGNOSIS — F411 Generalized anxiety disorder: Secondary | ICD-10-CM

## 2021-09-06 NOTE — Progress Notes (Signed)
?    [facility] Counselor/Therapist Progress Note ? ?Patient ID: Kim [name], MRN: [id number],   ? ?Date: 09/06/2021 ? ?Time Spent: 55 minutes  ? ?Treatment Type: Individual Therapy ? ?Reported Symptoms: anxiety, some ongoing challenges with Mother ? ?Mental Status Exam: ? ?Appearance:   Neat     ?Behavior:  Appropriate, Sharing, and Motivated  ?Motor:  Normal  ?Speech/Language:   Clear and Coherent  ?Affect:  Anxious   ?Mood:  anxious  ?Thought process:  goal directed  ?Thought content:    overthinking  ?Sensory/Perceptual disturbances:    WNL  ?Orientation:  oriented to person, place, time/date, situation, day of week, month of year, year, and stated date of September 06, 2021  ?Attention:  Good  ?Concentration:  Good  ?Memory:  WNL  ?Fund of knowledge:   Good  ?Insight:    Good  ?Judgment:   Good  ?Impulse Control:  Good  ? ?Risk Assessment: ?Danger to Self:  No ?Self-injurious Behavior: No ?Danger to Others: No ?Duty to Warn:no ?Physical Aggression / Violence:No  ?Access to Firearms a concern: No  ?Gang Involvement:No  ? ?Subjective:  Patient  in today reporting anxiety,overthinking, and trying to set healthier limits with situations and with other people. Is glad she changed jobs. Has new supervisor and pleased with that choice. Other changes coming up at work, adding to stress. Further work on issues she continues to have with her aging mom and having improved boundaries with mom.  Feels her decision making is better and more trust in herself to make good decisions.  Some decrease in overthinking.  Occasional anger but overall anger is reduced.  Frustration level higher at times usually in relation to interactions with mom.  Continued work on letting go of negative thoughts and feelings as well as disappointments in order to move forward emotionally.  ? ?Interventions: Solution-Oriented/Positive Psychology, Ego-Supportive, and Insight-Oriented ? ?Treatment goal plan: ?Patient not signing treatment plan on  computer screen due to Covid. ?Treatment Goal Plan: ?Goals remain on treatment plan as patient works with strategies to achieve her goals.  Progress will be documented each session and noted in "Progress" section of treatment plan. ?Long term goal: ?Reduce overall level, frequency, and intensity of the anxiety so that daily functioning is not impaired. 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 ?Short term goal: ?Identify, challenge, and replace anxious/fearful self-talk with positive, reality based, and empowering self talk.  ?Strategies: ?Teach the patient to implement "thought stopping" techniques for anxieties and worries that have been addressed but persist ? ?Diagnosis: ?  ICD-10-CM   ?1. Generalized anxiety disorder  F41.1   ?  ? ?Plan: Patient in today showing good motivation and active engagement in session today, working further on anxiety issues especially with her mom, work, and personal concerns/relationships.  Reports relationship issues processed last session are better at this point with appropriate boundaries. Further worked on specific issues with the relationship with her mom and better managing hurtful comments. 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 Less negative thoughts. Self-confidence some better, especially in her new job.  Continuing goal-directed behaviors especially some thought stopping in the midst of her anxieties and worries and also replacing anxious/negative self talk with more positive and empowering self talk. Encouraged patient  in her trying to practice more positive behaviors including: Staying in contact with other people that are supportive, intentionally looking for more positives versus negatives daily, recognizing and reflecting on her progress daily, getting  outside daily and walking, setting and keeping healthy boundaries with others, allowing for healthy sleep patterns, staying in the present focusing on what she can control or change, searching for the positives within herself and being able to name them, believing in herself more and her ability to make tough changes, for every negative thought create to positives, and recognize the strength she shows working with goal-directed behaviors to move forward in a direction that supports her improved emotional health and overall wellbeing. ? ?Goal review and progress/challenges noted with patient. ? ?Next appointment within 3 to 4 weeks. ? ?This record has been created using [employer].  Chart creation errors have been sought, but may not always have been located and corrected.  Such creation errors do not reflect on the standard of medical care provided. ? ? ?Shanon Ace, LCSW ? ? ? ? ? ? ? ? ? ? ? ? ? ? ? ? ? ? ?

## 2021-10-12 ENCOUNTER — Ambulatory Visit: Payer: BC Managed Care – PPO | Admitting: Psychiatry

## 2021-10-12 DIAGNOSIS — F411 Generalized anxiety disorder: Secondary | ICD-10-CM | POA: Diagnosis not present

## 2021-10-12 NOTE — Progress Notes (Signed)
?    [facility] Counselor/Therapist Progress Note ? ?Patient ID: Kim [name], MRN: [id number],   ? ?Date: 10/12/2021 ? ?Time Spent: 55 minutes  ? ?Treatment Type: Individual Therapy ? ?Reported Symptoms: anxiety (improved), automatic negative thoughts mostly when alone ? ?Mental Status Exam: ? ?Appearance:   Casual     ?Behavior:  Appropriate, Sharing, and Motivated  ?Motor:  Normal  ?Speech/Language:   Clear and Coherent  ?Affect:  anxious  ?Mood:  anxious  ?Thought process:  goal directed  ?Thought content:    WNL  ?Sensory/Perceptual disturbances:    WNL  ?Orientation:  oriented to person, place, time/date, situation, day of week, month of year, year, and stated date of Oct 12, 2021  ?Attention:  Good  ?Concentration:  Good  ?Memory:  WNL  ?Fund of knowledge:   Good  ?Insight:    Good  ?Judgment:   Good  ?Impulse Control:  Good  ? ?Risk Assessment: ?Danger to Self:  No ?Self-injurious Behavior: No ?Danger to Others: No ?Duty to Warn:no ?Physical Aggression / Violence:No  ?Access to Firearms a concern: No  ?Gang Involvement:No  ? ?Subjective: Patient in today reporting anxiety is improving and feeling encouraged by this. Sometimes anxious and having some sad thoughts increasing more recently and processed in session today. Feeling "I am afraid I'm going to end up just like my mom!"  "My mom stirs up the same fear in me."  "Fears being like mom in annoying things Mom says to me being critical of me, my church and other things." Concerns she has in being as much of a worrier as Mom, lot of criticalness, single and not feeling she doesn't fit in, negative thoughts, makes excuses for not joining in activities.  Also able to recognize ways in which she is stronger including not getting stuck in negative thoughts, looking to be a part of activities with others, some decrease in negative thinking, and trying to decrease her worrying. Seemed to feel more encouraged as she was able to reflect on some of the progress  she has been making and working on herself personally, herself professionally on the job, and herself as a daughter trying to be supportive of her mom who struggles with a lot of issues.  Despite some current stress at work, she is glad she made the job switch several months ago.  Anger remains reduced and frustration level is not as high as last session.  To continue working on more positive sense of self-esteem, lessening her negative thinking, and letting go of things she cannot control that get in her way of moving forward emotionally and feeling more optimistic. ? ?Interventions: Solution-Oriented/Positive Psychology and Insight-Oriented ? ?Treatment goal plan: ?Patient not signing treatment plan on computer screen due to Covid. ?Treatment Goal Plan: ?Goals remain on treatment plan as patient works with strategies to achieve her goals.  Progress will be documented each session and noted in "Progress" section of treatment plan. ?Long term goal: ?Reduce overall level, frequency, and intensity of the anxiety so that daily functioning is not impaired. 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 ?Short term goal: ?Identify, challenge, and replace anxious/fearful self-talk with positive, reality based, and empowering self talk.  ?Strategies: ?Teach the patient to implement "thought stopping" techniques for anxieties and worries that have been addressed but persist ? ?Diagnosis: ?  ICD-10-CM   ?1. Generalized anxiety disorder  F41.1   ?  ? ?  Plan:   Patient today working on her anxiety which is improving.  Progressing.  Current anxiety mostly related to some insecurity and issues with her mother, some fears related to the future, and some current anxiety that is work-related.  As noted above patient able to work through her concerns and feel more of an awareness about her growth, her strength, her gratitude for certain changes that have occurred for her, and a  sense of "being more aware that I am where I need to be right now". Encouraged patient with her practicing more positive behavior including: Staying in contact with other people that are supportive, intentionally looking for more positives versus negatives daily, recognizing her progress often, getting outside daily and walking, setting and keeping healthy boundaries with others, allowing for healthy sleep patterns, staying in the present focusing on what she can control or change, searching for the positives within herself and being able to name them, believing in herself more and her ability to make tough changes, for every negative thought create 2 positives, and realize the strength she shows when working with goal directed behaviors to move forward in a direction that supports her improved emotional health. ? ?Goal review and progress/challenges  ? ?Next appt within 4-5weeks.  ? ?This record has been created using [employer].  Chart creation errors have been sought, but may not always have been located and corrected.  Such creation errors do not reflect on the standard of medical care provided. ? ? ?Shanon Ace, LCSW ? ? ? ? ? ? ? ? ? ? ? ? ? ? ? ? ? ? ?

## 2021-10-17 ENCOUNTER — Ambulatory Visit: Payer: BC Managed Care – PPO | Admitting: Psychiatry

## 2021-11-23 ENCOUNTER — Ambulatory Visit (INDEPENDENT_AMBULATORY_CARE_PROVIDER_SITE_OTHER): Payer: BC Managed Care – PPO | Admitting: Psychiatry

## 2021-11-23 DIAGNOSIS — F411 Generalized anxiety disorder: Secondary | ICD-10-CM

## 2021-11-23 NOTE — Progress Notes (Signed)
[facility] Counselor/Therapist Progress Note  Patient ID: Kim [name], MRN: [id number],    Date: 11/23/2021  Time Spent: 50 minutes   Treatment Type: Individual Therapy  Reported Symptoms: anxiety, some decrease in automatic negative thoughts, difficulty setting limits with others.    Mental Status Exam:  Appearance:   Neat     Behavior:  Appropriate, Sharing, and Motivated  Motor:  Normal  Speech/Language:   Clear and Coherent  Affect:  anxious  Mood:  anxious  Thought process:  goal directed  Thought content:    overthinking  Sensory/Perceptual disturbances:    WNL  Orientation:  oriented to person, place, time/date, situation, day of week, month of year, year, and stated date of November 24, 2030  Attention:  Good  Concentration:  Good  Memory:  WNL  Fund of knowledge:   Good  Insight:    Good  Judgment:   Good  Impulse Control:  Good   Risk Assessment: Danger to Self:  No Self-injurious Behavior: No Danger to Others: No Duty to Warn:no Physical Aggression / Violence:No  Access to Firearms a concern: No  Gang Involvement:No   Subjective:   Patient in today reporting anxiety, a lot of which relates to her relationship with mother. Needed to process several situations recently  which helped patient in decreasing her anxiety and not feel so guilty when mother's demands are excessive and patient needs to say "no". Feels she needs to set some limits with mom but mom tends to manipulate her so patient says it's difficult to set appropriate boundaries. Worked together on patient being able to set some healthier limits with mom and interrupt attempts when she feels mom is being manipulative, but yet definitely wants to respond when needed.  Gets overstressed with the repeated calls that are not urgent, and feels she ends up not having much time for herself as her work schedule is steady.  Patient does seem to feel that part of the problem is she has not set effective  boundaries but understandably this can be more challenging when it is apparent involved.  Overall patient doing better, fewer negative thoughts, tries to have contact with friends when she is able, does enjoy her church and the contacts she has there.  Anger has continued to be reduced and frustration level not as elevated.  Self-esteem has improved some and continues working to let go of things that she knows she cannot control as she tries to move forward emotionally in a more positive direction and feeling optimistic about herself.   Interventions: Cognitive Behavioral Therapy and Solution-Oriented/Positive Psychology  Long term goal: Reduce overall level, frequency, and intensity of the anxiety so that daily functioning is not impaired. 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 Short term goal: Identify, challenge, and replace anxious/fearful self-talk with positive, reality based, and empowering self talk.  Strategies: Teach the patient to implement "thought stopping" techniques for anxieties and worries that have been addressed but persist  Diagnosis:   ICD-10-CM   1. Generalized anxiety disorder  F41.1      Plan: Patient today showing good motivation and participation in session as she worked on some anxiety especially regarding relationship with her mother and needing to set limits but finding it hard because "it is my mother".  Tends to get drained and have little time for herself outside of work due to to [employer] overreliance on her  for situations that are not critical.  Adds to patient's stress level and also hard for patient to say no and not feel guilty, which we focused on a lot today.  Patient to practice some different strategies per our discussion today, and also continue working on her self-esteem issues and letting go of things that she cannot control, while choosing to emphasize her positives versus what she feels may be  negatives.  Encouraged patient to be very self aware of her strengths, her personal growth, and her gratitude for certain changes that she has been able to make, as she continues with goal directed behaviors especially aimed at better self-care and effective limit setting as needed with others. Encouraged patient in her practice of more positive behaviors including: Intentionally looking for more positives versus negatives daily, staying in contact with other people that are supportive, recognizing her progress often, get outside daily and walk, setting and keeping healthy boundaries with others, allowing for healthy sleep patterns, staying in the present focusing what she can control or change, searching for the positives within herself and be able to name them, believing in herself more and her ability to make tough changes, and recognize the strength she shows when working with goal directed behaviors to move in a direction that supports her improved emotional health and overall outlook.  Goal review and progress/challenges noted with patient.  Next appointment within 3 to 4 weeks.  This record has been created using [employer].  Chart creation errors have been sought, but may not always have been located and corrected.  Such creation errors do not reflect on the standard of medical care provided.   Shanon Ace, LCSW

## 2022-01-10 ENCOUNTER — Ambulatory Visit: Payer: BC Managed Care – PPO | Admitting: Psychiatry

## 2022-01-12 ENCOUNTER — Ambulatory Visit (INDEPENDENT_AMBULATORY_CARE_PROVIDER_SITE_OTHER): Payer: BC Managed Care – PPO | Admitting: Psychiatry

## 2022-01-12 DIAGNOSIS — F411 Generalized anxiety disorder: Secondary | ICD-10-CM | POA: Diagnosis not present

## 2022-01-12 NOTE — Progress Notes (Signed)
[facility] Counselor/Therapist Progress Note  Patient ID: Kim [name], MRN: [id number],    Date: 01/12/2022  Time Spent: 55 minutes   Treatment Type: Individual Therapy  Reported Symptoms: anxiety, stressed uncertainties with work  Mental Status Exam:  Appearance:   Neat     Behavior:  Appropriate, Sharing, and Motivated  Motor:  Normal  Speech/Language:   Clear and Coherent  Affect:  anxiety  Mood:  anxious  Thought process:  goal directed  Thought content:    WNL  Sensory/Perceptual disturbances:    WNL  Orientation:  oriented to person, place, time/date, situation, day of week, month of year, year, and stated date of January 12, 2022  Attention:  Good  Concentration:  Good  Memory:  WNL  Fund of knowledge:   Good  Insight:    Good  Judgment:   Good  Impulse Control:  Good   Risk Assessment: Danger to Self:  No Self-injurious Behavior: No Danger to Others: No Duty to Warn:no Physical Aggression / Violence:No  Access to Firearms a concern: No  Gang Involvement:No   Subjective: Patient in today reporting anxiety as her main symptom and mostly related to personal and family concerns. Struggles with feelings of being weak some since last appt and described this in more detail, giving examples as well as how she had dealt with it but seems to have subsided some at this point. Explains that she's more conscious of aging and it reminds her of her mom, and she doesn't like that. Discussed more of her aging concerns that relate to her anxiety. 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 Multiple changes that heavily impact her and will as the school year begins. 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 Did well in talking through these issues of transition and loss, and we talked about her need for continued support but also using these 2  weeks for some much needed rest and to follow through on suggested positive self-care.   Interventions: Cognitive Behavioral Therapy and Ego-Supportive   Treatment goal plan: Patient not signing treatment plan on computer screen due to Covid. Treatment Goal Plan: Goals remain on treatment plan as patient works with strategies to achieve her goals.  Progress will be documented each session and noted in "Progress" section of treatment plan. Long term goal: Reduce overall level, frequency, and intensity of the anxiety so that daily functioning is not impaired. 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 Short term goal: Identify, challenge, and replace anxious/fearful self-talk with positive, reality based, and empowering self talk.  Strategies: Teach the patient to implement "thought stopping" techniques for anxieties and worries that have been addressed but persist   Diagnosis:   ICD-10-CM   1. Generalized anxiety disorder  F41.1      Plan: Patient in today working on personal and family issues, and mostly on work changes and re-arranges that are heavily impacting her. Unexpected staff losses and transitions, involving people which patient was very close. Mixed sadness but by end of session, patient better able to re-focus on upcoming school year and having an attitude of looking for what might go well (versus focus on what might not go well) as the new school year soon begins.  Looking at her own strengths and  feeling good about them.  Did seem to feel less anxious upon leaving today and to continue work with some of her anxiety and other areas of life including her mom, still being supportive but with some appropriate boundaries as needed.  Emphasized the growth and strength that she has shown over the last several months which is helping to sustain her as she tries to move forward even in the face of some losses and uncertainties, and  considering how she might positively impact her work group again this coming school year.  Less anger.  Frustration reduce some.  Continues to work on letting go of things "out of my control" and holding to her positives that has been a definite plus for her in her work environment. Encouraged patient in practicing more positive behaviors including: Remaining in contact with other people that are supportive, intentionally looking for more positives versus negatives daily, recognizing progress often, getting outside daily and walking, setting and keeping healthy boundaries with others, allowing for healthy sleep patterns, remain in the present focusing on what she can control or change, search for positives within herself and be able to name them, believing in herself more and her ability to make tough changes, and recognize the strength she shows working with goal directed behaviors to move forward in a direction that supports her improved emotional health and overall outlook.  Goal review and progress/challenges noted with patient.  Next appointment within approximately 4 weeks.  This record has been created using [employer].  Chart creation errors have been sought, but may not always have been located and corrected.  Such creation errors do not reflect on the standard of medical care provided.   Shanon Ace, LCSW

## 2022-03-13 ENCOUNTER — Ambulatory Visit (INDEPENDENT_AMBULATORY_CARE_PROVIDER_SITE_OTHER): Payer: BC Managed Care – PPO | Admitting: Psychiatry

## 2022-03-13 DIAGNOSIS — F411 Generalized anxiety disorder: Secondary | ICD-10-CM

## 2022-03-13 NOTE — Progress Notes (Signed)
[facility] Counselor/Therapist Progress Note  Patient ID: Kim [name], MRN: [id number],    Date: 03/13/2022  Time Spent: 55 minutes   Treatment Type: Individual Therapy  Reported Symptoms: anxiety related to personal, family, and work stressors  Mental Status Exam:  Appearance:   Well Groomed     Behavior:  Appropriate, Sharing, and Motivated  Motor:  Normal  Speech/Language:   Clear and Coherent  Affect:  anxious  Mood:  anxious  Thought process:  goal directed  Thought content:    overthinking  Sensory/Perceptual disturbances:    WNL  Orientation:  oriented to person, place, time/date, situation, day of week, month of year, year, and stated date of Oct. 2, 2023  Attention:  Good  Concentration:  Good  Memory:  WNL  Fund of knowledge:   Good  Insight:    Good  Judgment:   Good  Impulse Control:  Good   Risk Assessment: Danger to Self:  No Self-injurious Behavior: No Danger to Others: No Duty to Warn:no Physical Aggression / Violence:No  Access to Firearms a concern: No  Gang Involvement:No   Subjective: Patient in today reporting anxiety as her main symptom and mostly related to personal, relationship with mom, and work.  Focused more on relationship with mom and how difficult it is at times in trying to help mother when she really needs help and not be manipulated. Also working on not taking all of mom's criticism so personal. Be able to communicate with mom (as worked on in session today, having healthier boundaries with mom, communicating more directly, and not feeling guilty. Having "conversations in my head" with my mom and our relationship challenges. 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 Processed work situations that have occurred recently and impacted her stress level daily. More conscious of aging and how it reminds her of her mother which is a sad feeling for patient as we discussed this  more in session today and seemed to be helpful to patient in feeling some healthier boundaries between her and mom.Some changes in her job this year at school. Focused on more positive self-talk and self-care which patient is continuing to work on at work, home, and beyond.   Interventions: Cognitive Behavioral Therapy and Ego-Supportive  Treatment goal plan: Patient not signing treatment plan on computer screen due to Covid. Treatment Goal Plan: Goals remain on treatment plan as patient works with strategies to achieve her goals.  Progress will be documented each session and noted in "Progress" section of treatment plan. Long term goal: Reduce overall level, frequency, and intensity of the anxiety so that daily functioning is not impaired. 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 Short term goal: Identify, challenge, and replace anxious/fearful self-talk with positive, reality based, and empowering self talk.  Strategies: Teach the patient to implement "thought stopping" techniques for anxieties and worries that have been addressed but persist  Diagnosis:   ICD-10-CM   1. Generalized anxiety disorder  F41.1      Plan: Patient today showing good motivation and active participation in session focusing more on her anxiety and personal relationships, relationships with her mom and work.  Stated she needed to focus more on the anxiety that comes up in relationship with mom which she did well in confronting and looking at certain choices she has in responding to mom.  Discussed patient  being able to respond differently to mom when they are in the midst of difficult conversations or mom's heavy complaining about things that are out of patient's control.  Also looked at some healthier boundaries with her mother and boundaries that she could have in place without guilt, and use some specific examples with patient.  Also worked on her being able  to let go of conversations about mom that she "has in her head" and again used specific examples with patient seemed to be helpful.  Work on practicing some of the better boundaries and responses with mom in between sessions and will share at next session.  Emphasized patient's strengths with her and also the growth that she is showing even in difficult situations.  Frustrated but also believing in herself to manage some of her stressors better.  Continues to work on being able to "let go of things out of my control", emphasizing the positives versus the negatives. Encouraged patient in practicing more positive behaviors that support her goals including: Staying in contact with other people that are supportive, intentionally looking for more positives versus negatives daily, recognizing progress often, getting outside daily and walking, setting and keeping healthy boundaries with others, allowing for healthy sleep patterns, remain in the present focusing on what she can control or change, searching for positives within herself and be able to name them, believing in herself more and her ability to make tough changes, and realize the strengths she shows working with goal directed behaviors to move forward in a direction that supports her improved emotional health.  Goal review and progress/challenges noted with patient.  Next appointment in approximately 4 weeks.  This record has been created using [employer].  Chart creation errors have been sought, but may not always have been located and corrected.  Such creation errors do not reflect on the standard of medical care provided.   Shanon Ace, LCSW

## 2022-05-03 ENCOUNTER — Ambulatory Visit: Payer: BC Managed Care – PPO | Admitting: Psychiatry

## 2022-05-03 DIAGNOSIS — F411 Generalized anxiety disorder: Secondary | ICD-10-CM | POA: Diagnosis not present

## 2022-05-03 NOTE — Progress Notes (Signed)
[facility] Counselor/Therapist Progress Note  Patient ID: Kim [name], MRN: [id number],    Date: 05/03/2022  Time Spent: 55 minutes   Treatment Type: Individual Therapy  Reported Symptoms: anxiety; Reports no medical health changes . Mental Status Exam:  Appearance:   Casual and Neat     Behavior:  Appropriate, Sharing, and Motivated  Motor:  Normal  Speech/Language:   Clear and Coherent  Affect:  Appropriate  Mood:  anxious  Thought process:  goal directed  Thought content:    WNL  Sensory/Perceptual disturbances:    WNL  Orientation:  oriented to person, place, time/date, situation, day of week, month of year, year, and stated date of Nov. 22, 2023  Attention:  Good  Concentration:  Good  Memory:  WNL  Fund of knowledge:   Good  Insight:    Good  Judgment:   Good  Impulse Control:  Good   Risk Assessment: Danger to Self:  No Self-injurious Behavior: No Danger to Others: No Duty to Warn:no Physical Aggression / Violence:No  Access to Firearms a concern: No  Gang Involvement:No   Subjective:  Patient in today reporting anxiety and frustration, mostly personal/family/work stressors. Also very impacted re: [place] crisis and has good friend who are Jewish. Talked through work her concerns about [place] conflict and "what all can happen". Worked with her anxious thoughts and realizing how her focusing so heavily on old violence and my thoughts/fears were getting more unrealistic. Has curbed her watching and tuning into the news.Concerns expressed for her mom with some health issues, although needing healthy boundaries as well. Looked at ways to be helpful and supportive but not enabling and also having some boundaries in place, without guilt. Also practiced some better communication strategies and stress management strategies to use with mom that might be more helpful at this point. Trying to not let herself be manipulated by mother, but also being "neutral" as  needed. Healthy boundaries a real focus for her right now especially within family. Realizing (and therapist supported) she is "needing more me-time for myself to help me in being healthy emotionally and physically."   Interventions: Cognitive Behavioral Therapy and Ego-Supportive  Long term goal: Reduce overall level, frequency, and intensity of the anxiety so that daily functioning is not impaired. 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 Short term goal: Identify, challenge, and replace anxious/fearful self-talk with positive, reality based, and empowering self talk.  Strategies: Teach the patient to implement "thought stopping" techniques for anxieties and worries that have been addressed but persist  Diagnosis:   ICD-10-CM   1. Generalized anxiety disorder  F41.1      Plan:  Patient today showing good motivation and participation in session working further on family, work, and personal issues and how stress in these areas can be managed more effectively.  Has been stressed trying to take care of her own self as her need for more rest and time to herself is increased due to high stress at work.  Patient trying to take care of herself while also being a support to her parents who are divorced.  Mother can be quite demanding at times and patient is working on having healthy limits but also responding to her in ways that patient feels good about.  Finding it difficult to sometimes care for her mom and also have healthy boundaries in place that mom will understand/respect, without patient  feeling some guilt. Seemed to feel supported and more encouraged by session end.  Patient continuing to show personal growth and strength in her setting of healthier boundaries, however maintaining them can be a challenge as she continues to work on this.  Showing more personal strength and interactions with mother and within her work environment.  Encouraged patient in her practice of positive behaviors as noted in session including: Intentionally looking for more positives versus negatives each day, see her own progress often, staying in contact with people that are supportive, get outside some each day and walk, setting and keeping healthy boundaries with others, allow for good sleep patterns, stay in the present focusing on what she can change her control, believing herself more and her ability to make significant changes, emphasize her strengths, and realize the progress she shows in working with goal-directed behaviors to move forward in a direction that supports her improved emotional health.  Goal review and progress/challenges noted with patient.  Next appointment within approximately 4 weeks.  This record has been created using [employer].  Chart creation errors have been sought, but may not always have been located and corrected.  Such creation errors do not reflect on the standard of medical care provided.   Shanon Ace, LCSW

## 2022-06-26 ENCOUNTER — Ambulatory Visit: Payer: BC Managed Care – PPO | Admitting: Psychiatry

## 2022-06-26 DIAGNOSIS — F411 Generalized anxiety disorder: Secondary | ICD-10-CM | POA: Diagnosis not present

## 2022-06-26 NOTE — Progress Notes (Signed)
[facility] Counselor/Therapist Progress Note  Patient ID: Kim [name], MRN: [id number],    Date: 06/26/2022  Time Spent: 55 minutes   Treatment Type: Individual Therapy  Reported Symptoms: anxiety, stressed re: family concerns  Mental Status Exam:  Appearance:   Casual and Neat     Behavior:  Appropriate, Sharing, and Motivated  Motor:  Normal  Speech/Language:   Clear and Coherent  Affect:  Anxious, frustration  Mood:  anxious  Thought process:  goal directed  Thought content:    Some obsessive thoughts "at times"  Sensory/Perceptual disturbances:    WNL  Orientation:  oriented to person, place, time/date, situation, day of week, month of year, year, and stated date of Jan. 15, 2024  Attention:  Good  Concentration:  Good  Memory:  WNL  Fund of knowledge:   Good  Insight:    Good  Judgment:   Good  Impulse Control:  Good   Risk Assessment: Danger to Self:  No Self-injurious Behavior: No Danger to Others: No Duty to Warn:no Physical Aggression / Violence:No  Access to Firearms a concern: No  Gang Involvement:No   Subjective: Patient in today reporting anxiety as her main symptom and relate mostly to persona/family. Work stressors are some more manageable than previously.  Some work concerns processed in more detail today and looking at some changes she can make to improve her situation, as well as listening as others vent their concern when working together on certain tasks. States relationship with mom has worsened and needed to work on this in session today. Realizing how mom is very demanding of patient and goes off frequently on negative tangents. Worked on some healthier boundaries and communication skills especially within her family (mom and dad). Some improvement and trust with patient, who is wanting to practice healthier boundaries and focused heavily on this today, using several examples. Trying to be supportive of dad and mom who are divorced, but yet  not get caught up in taking "sides" between the two. Is protective of her "me-time" for her aown emotional healthy and well being.  Interventions: Cognitive Behavioral Therapy and Ego-Supportive  Long term goal: Reduce overall level, frequency, and intensity of the anxiety so that daily functioning is not impaired. 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 Short term goal: Identify, challenge, and replace anxious/fearful self-talk with positive, reality based, and empowering self talk.  Strategies: Teach the patient to implement "thought stopping" techniques for anxieties and worries that have been addressed but persist  Diagnosis:   ICD-10-CM   1. Generalized anxiety disorder  F41.1      Plan: Patient in today and showing progress and good motivation in further work on her goals re: anxiety and some family relationship issues. Trying to effectively handle some manipulation and be present for her divorced parents, with dad remarried years ago. Becoming more resilient and less "defeated" by her mom and realizes she needs ongoing clear boundaries with her, and discussed this at length today including specific situations and comments that have been made and negatively impacted patient. Patient working on rebounding better from negative situations/comments and better ways of setting limits and coaching herself through stressors.Showing some improved strength and self confidence. Encourage patient and practicing positive behaviors as discussed in session including: Seeing her on progress more often, staying in contact with people that are supportive, setting and keeping healthy boundaries with others, intentionally looking  for the positives daily, getting outside some and walking, allow for good sleep patterns, remain in the present focusing on what she can change or control, believing in herself more and her ability to make significant changes,  and recognize the progress she shows in working with goal-directed behaviors to move forward in a direction that supports her improved emotional health and outlook.  Goal review and progress/challenges noted with patient.  Next appointment within approximately 3-4 months  This record has been created using [employer].  Chart creation errors have been sought, but may not always have been located and corrected.  Such creation errors do not reflect on the standard of medical care provided.   Shanon Ace, LCSW

## 2022-08-07 ENCOUNTER — Other Ambulatory Visit: Payer: Self-pay

## 2022-08-07 ENCOUNTER — Emergency Department (HOSPITAL_BASED_OUTPATIENT_CLINIC_OR_DEPARTMENT_OTHER): Payer: BC Managed Care – PPO | Admitting: Radiology

## 2022-08-07 ENCOUNTER — Encounter (HOSPITAL_BASED_OUTPATIENT_CLINIC_OR_DEPARTMENT_OTHER): Payer: Self-pay

## 2022-08-07 ENCOUNTER — Emergency Department (HOSPITAL_BASED_OUTPATIENT_CLINIC_OR_DEPARTMENT_OTHER)
Admission: EM | Admit: 2022-08-07 | Discharge: 2022-08-07 | Disposition: A | Discharge status: Home / Self Care | Payer: BC Managed Care – PPO | Attending: Emergency Medicine | Admitting: Emergency Medicine

## 2022-08-07 DIAGNOSIS — W109XXA Fall (on) (from) unspecified stairs and steps, initial encounter: Secondary | ICD-10-CM | POA: Insufficient documentation

## 2022-08-07 DIAGNOSIS — R55 Syncope and collapse: Secondary | ICD-10-CM | POA: Insufficient documentation

## 2022-08-07 DIAGNOSIS — M25531 Pain in right wrist: Secondary | ICD-10-CM | POA: Diagnosis present

## 2022-08-07 DIAGNOSIS — S6000XA Contusion of unspecified finger without damage to nail, initial encounter: Secondary | ICD-10-CM | POA: Diagnosis not present

## 2022-08-07 DIAGNOSIS — M25551 Pain in right hip: Secondary | ICD-10-CM | POA: Insufficient documentation

## 2022-08-07 LAB — URINALYSIS, ROUTINE W REFLEX MICROSCOPIC
Bacteria, UA: NONE SEEN
Bilirubin Urine: NEGATIVE
Glucose, UA: NEGATIVE mg/dL
Ketones, ur: NEGATIVE mg/dL
Leukocytes,Ua: NEGATIVE
Nitrite: NEGATIVE
Protein, ur: NEGATIVE mg/dL
Specific Gravity, Urine: 1.008 (ref 1.005–1.030)
pH: 6 (ref 5.0–8.0)

## 2022-08-07 LAB — BASIC METABOLIC PANEL
Anion gap: 9 (ref 5–15)
BUN: 14 mg/dL (ref 6–20)
CO2: 26 mmol/L (ref 22–32)
Calcium: 9.1 mg/dL (ref 8.9–10.3)
Chloride: 103 mmol/L (ref 98–111)
Creatinine, Ser: 0.69 mg/dL (ref 0.44–1.00)
GFR, Estimated: >60 mL/min (ref >60)
Glucose, Bld: 124 mg/dL — ABNORMAL HIGH (ref 70–99)
Potassium: 4 mmol/L (ref 3.5–5.1)
Sodium: 138 mmol/L (ref 135–145)

## 2022-08-07 LAB — CBC WITH DIFFERENTIAL/PLATELET
Abs Immature Granulocytes: 0.03 10*3/uL (ref 0.00–0.07)
Basophils Absolute: 0 10*3/uL (ref 0.0–0.1)
Basophils Relative: 1 %
Eosinophils Absolute: 0.1 10*3/uL (ref 0.0–0.5)
Eosinophils Relative: 1 %
HCT: 37.5 % (ref 36.0–46.0)
Hemoglobin: 12.5 g/dL (ref 12.0–15.0)
Immature Granulocytes: 0 %
Lymphocytes Relative: 11 %
Lymphs Abs: 0.9 10*3/uL (ref 0.7–4.0)
MCH: 28.5 pg (ref 26.0–34.0)
MCHC: 33.3 g/dL (ref 30.0–36.0)
MCV: 85.4 fL (ref 80.0–100.0)
Monocytes Absolute: 0.8 10*3/uL (ref 0.1–1.0)
Monocytes Relative: 9 %
Neutro Abs: 6.4 10*3/uL (ref 1.7–7.7)
Neutrophils Relative %: 78 %
Platelets: 215 10*3/uL (ref 150–400)
RBC: 4.39 MIL/uL (ref 3.87–5.11)
RDW: 12.7 % (ref 11.5–15.5)
WBC: 8.2 10*3/uL (ref 4.0–10.5)
nRBC: 0 % (ref 0.0–0.2)

## 2022-08-07 LAB — TROPONIN I (HIGH SENSITIVITY): Troponin I (High Sensitivity): 3 ng/L (ref <18)

## 2022-08-07 MED ORDER — LACTATED RINGERS IV BOLUS
1000.0000 mL | Freq: Once | INTRAVENOUS | Status: AC
  Administered 2022-08-07: 1000 mL via INTRAVENOUS

## 2022-08-07 NOTE — ED Triage Notes (Addendum)
Pt sts that she was having congestion and indigestion prior to getting up and having hot flashes/dizziness. C/o midsternal anterior chest pain, right hip and right wrist pain. Unsure if she hit head due to passing out but denies head pain. No blood thinners.

## 2022-08-07 NOTE — ED Triage Notes (Signed)
BIB [facility], A&O x 4, GCS 15  Getting into bed became dizzy/hot, called 911, went down steps attempting to open door, had syncopal episode and remembers waking up on floor, did fall down steps approx 6 steps, right wrist pain and right hip pain.  '4mg'$  zofran given en route, 25m given of NS  122/72 100 standing 136/82 98 sitting  112 HR, 142/74 last set of vitals with EMS

## 2022-08-07 NOTE — ED Provider Notes (Signed)
[facility]  Provider Note  CSN: [id number] Arrival date & time: 08/07/22 0118  History Chief Complaint  Patient presents with   Loss of Consciousness    Kim [name] is a 57 y.o. female with history of anxiety reports she was having some nasal congestion today and took some cough syrup before going to bed. She began feeling flushed and lightheaded while in bed and so she called EMS. She was walking down the stairs to unlock the door when she lost consciousness and fell down several steps. She was able to get up and walk to her couch shortly afterwards. EMS then arrived and found her to be orthostatic and transported her to the ED for evaluation. She reports R wrist and hip pain as well as sternal pain. She denies any head injury. She is feeling anxious about being in the ED and reports tachycardia is typical for her in that situation.    Home Medications Prior to Admission medications   Medication Sig Start Date End Date Taking? Authorizing Provider  Ferrous Sulfate Dried (HIGH POTENCY IRON) 65 MG TABS Take by mouth daily.    [provider]  levothyroxine (SYNTHROID) 125 MCG tablet Take 125 mcg by mouth daily before breakfast.    [provider]  naproxen (NAPROSYN) 500 MG tablet take 1 tablet po pc bid x 5 days, then prn-post operative pain (first dose 6 pm day of surgery) 07/22/19   Earnstine Regal, PA-C  oxyCODONE-acetaminophen (PERCOCET/ROXICET) 5-325 MG tablet take 1 tablet po every 6 hours for breakthrough post operative pain 07/22/19   Earnstine Regal, PA-C  UNABLE TO FIND Vitamin d 3 1000 iu daily    [provider]     Allergies    Patient has no known allergies.   Review of Systems   Review of Systems Please see HPI for pertinent positives and negatives  Physical Exam BP (!) 119/105   Pulse (!) 106   Temp 98.2 F (36.8 C) (Oral)   Resp 19   Ht '5\' 6"'$  (1.676 m)   Wt 61.7 kg   SpO2 97%   BMI  21.95 kg/m   Physical Exam Vitals and nursing note reviewed.  Constitutional:      Appearance: Normal appearance.  HENT:     Head: Normocephalic and atraumatic.     Nose: Nose normal.     Mouth/Throat:     Mouth: Mucous membranes are moist.  Eyes:     Extraocular Movements: Extraocular movements intact.     Conjunctiva/sclera: Conjunctivae normal.  Cardiovascular:     Rate and Rhythm: Tachycardia present.  Pulmonary:     Effort: Pulmonary effort is normal.     Breath sounds: Normal breath sounds.  Abdominal:     General: Abdomen is flat.     Palpations: Abdomen is soft.     Tenderness: There is no abdominal tenderness.  Musculoskeletal:        General: Tenderness (R wrist, R hip) present. No swelling or deformity. Normal range of motion.     Cervical back: Neck supple.  Skin:    General: Skin is warm and dry.  Neurological:     General: No focal deficit present.     Mental Status: She is alert.  Psychiatric:        Mood and Affect: Mood normal.     ED Results / Procedures / Treatments   EKG EKG Interpretation  Date/Time:  Monday August 07 2022 01:25:50 EST  Ventricular Rate:  113 PR Interval:  166 QRS Duration: 82 QT Interval:  321 QTC Calculation: 441 R Axis:   75 Text Interpretation: Sinus tachycardia Otherwise within normal limits No old tracing to compare Confirmed by Calvert Cantor [id number]) on 08/07/2022 2:11:04 AM  Procedures Procedures  Medications Ordered in the ED Medications  lactated ringers bolus 1,000 mL (0 mLs Intravenous Stopped 08/07/22 0315)    Initial Impression and Plan  Patient here with syncope and orthostatic vitals with EMS. Will check labs and begin IVF. Send for imaging of her areas of injury.   ED Course   Clinical Course as of 08/07/22 Tonita Cong Aug 07, 2022  0224 I personally viewed the images from radiology studies and agree with radiologist interpretation: Xrays neg for acute injury [CS]  0226 CBC is normal.  [CS]  [id number]  Trop is normal.  [CS]  [id number] BMP is normal.  [CS]  [id number] UA is normal.  [CS]  0423 Patient reports she is feeling better. No dizziness when walking to the rest room. No clear etiology for her lightheadedness or syncope, but does not appear to be an acute or life threatening process. Recommend rest, hydration and icing of her injured/bruised areas and PCP follow up. RTED for any other concerns.  [CS]    Clinical Course User Index [CS] Truddie Hidden, MD     MDM Rules/Calculators/A&P Medical Decision Making Given presenting complaint, I considered that admission might be necessary. After review of results from ED lab and/or imaging studies, admission to the hospital is not indicated at this time.    Problems Addressed: Contusion of finger of right hand, initial encounter: acute illness or injury Syncope, unspecified syncope type: acute illness or injury  Amount and/or Complexity of Data Reviewed Labs: ordered. Decision-making details documented in ED Course. Radiology: ordered and independent interpretation performed. Decision-making details documented in ED Course. ECG/medicine tests: ordered and independent interpretation performed. Decision-making details documented in ED Course.  Risk Decision regarding hospitalization.     Final Clinical Impression(s) / ED Diagnoses Final diagnoses:  Syncope, unspecified syncope type  Contusion of finger of right hand, initial encounter    Rx / DC Orders ED Discharge Orders     None        Truddie Hidden, MD 08/07/22 0425

## 2022-08-07 NOTE — ED Notes (Signed)
Patient given ginger ale and peanut butter  crackers per request.

## 2022-08-07 NOTE — ED Notes (Signed)
Patient ambulated to restroom with steady gait.

## 2022-08-15 ENCOUNTER — Ambulatory Visit (INDEPENDENT_AMBULATORY_CARE_PROVIDER_SITE_OTHER): Payer: BC Managed Care – PPO | Admitting: Psychiatry

## 2022-08-15 DIAGNOSIS — F411 Generalized anxiety disorder: Secondary | ICD-10-CM

## 2022-08-15 NOTE — Progress Notes (Signed)
[facility] Counselor/Therapist Progress Note  Patient ID: Kim [name], MRN: [id number],    Date: 08/15/2022  Time Spent: 55 minutes     Treatment Type: Individual Therapy  Reported Symptoms:  anxiety, worrying  Mental Status Exam:  Appearance:   Casual and Neat     Behavior:  Appropriate, Sharing, and Motivated  Motor:  Normal  Speech/Language:   Clear and Coherent  Affect:  anxious  Mood:  anxious  Thought process:  goal directed  Thought content:    WNL  Sensory/Perceptual disturbances:    WNL  Orientation:  oriented to person, place, time/date, situation, day of week, month of year, year, and stated date of August 15, 2022  Attention:  Good  Concentration:  Good  Memory:  WNL  Fund of knowledge:   Good  Insight:    Good  Judgment:   Good  Impulse Control:  Good   Risk Assessment: Danger to Self:  No Self-injurious Behavior: No Danger to Others: No Duty to Warn:no Physical Aggression / Violence:No  Access to Firearms a concern: No  Gang Involvement:No   Subjective:  Patient today in session and reporting anxiety mostly regarding work, Charity fundraiser, 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 Suddenly she burst into tears and describing problems she was having after an episode she had recently experienced due to some type of health event where she passed out and fell down part of the stairs after having called EMS. Was checked out at hospital and is doing ok now physically. Still trying to work beyond the experience emotionally.  Felt that it was helpful to be able to discuss all this in session today as she has had difficulty "getting beyond the experience." Some issues within family continue but patient reports managing them better. Work stressors stronger more recently and scope of her work has significantly increased. Trying not to get overwhelmed. Reports her dept may get re-classified and be able to earn more. Worked more today on ways to better manage her stress level, and also help her realize  more of her strengths. Also continued some work to create healthier limits/boundaries for herself which she feels will make a big difference for her. Commits to having more time for herself which she states is very important for her at this time.  Interventions: Cognitive Behavioral Therapy and Ego-Supportive  Long term goal: Reduce overall level, frequency, and intensity of the anxiety so that daily functioning is not impaired. 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 Short term goal: Identify, challenge, and replace anxious/fearful self-talk with positive, reality based, and empowering self talk.  Strategies: Teach the patient to implement "thought stopping" techniques for anxieties and worries that have been addressed but persist  Diagnosis:   ICD-10-CM   1. Generalized anxiety disorder  F41.1      Plan:  Patient today actively involved in session and showing good motivation as she worked further on her anxiety, stress, and fears regarding a recent health emergency which was quite scary for patient at the time. Anxiety overall has decreased some. Less overall tearfulness. Understanding "myself better". Asks for help when needed. Some improvement in self esteem as she continues to work on this. Making progress and needs to continue with goal directed behaviors to keep moving in a positive direction. Increased resilience. Encouraged to continue healthier limit setting.  Encouraged patient in her practice of positive/self affirming behaviors as noted in session including: Recognizing the  progress she has made more often, staying in contact with people that are supportive, setting and keeping healthy boundaries with others, intentionally looking more for the positives each day, getting outside some and walking, allowing for good sleep patterns, remain in the present focusing on what she can control or change, believing in herself or in her  ability to make significant changes, and realize the progress she shows working with goal-directed behaviors to move forward in a direction that supports her improved emotional health and overall wellbeing.  Goal review and progress/challenges noted with patient.  Next appointment within 4 weeks.  This record has been created using [employer].  Chart creation errors have been sought, but may not always have been located and corrected.  Such creation errors do not reflect on the standard of medical care provided.   Shanon Ace, LCSW

## 2022-08-15 NOTE — Patient Instructions (Signed)
es

## 2022-09-28 ENCOUNTER — Ambulatory Visit (INDEPENDENT_AMBULATORY_CARE_PROVIDER_SITE_OTHER): Payer: BC Managed Care – PPO | Admitting: Psychiatry

## 2022-09-28 DIAGNOSIS — F411 Generalized anxiety disorder: Secondary | ICD-10-CM | POA: Diagnosis not present

## 2022-09-28 NOTE — Progress Notes (Signed)
[facility] Counselor/Therapist Progress Note  Patient ID: Kim [name], MRN: [id number],    Date: 09/28/2022  Time Spent: 55 minutes   Treatment Type: Individual Therapy  Reported Symptoms: anxiety  Mental Status Exam:  Appearance:   Casual and Neat     Behavior:  Appropriate, Sharing, and Motivated  Motor:  Normal  Speech/Language:   Clear and Coherent  Affect:  anxiety  Mood:  anxious  Thought process:  goal directed  Thought content:    Obsessive thoughts come and go  Sensory/Perceptual disturbances:    WNL  Orientation:  oriented to person, place, time/date, situation, day of week, month of year, year, and stated date of September 28, 2022  Attention:  Good  Concentration:  Good  Memory:  WNL  Fund of knowledge:   Good  Insight:    Good  Judgment:   Good  Impulse Control:  Good   Risk Assessment: Danger to Self:  No Self-injurious Behavior: No Danger to Others: No Duty to Warn:no Physical Aggression / Violence:No  Access to Firearms a concern: No  Gang Involvement:No   Subjective:  Patient in today and reporting anxiety, and stressors related to work/personal/family. Work stressors include possible reductions of jobs at her work. Stressed, anxious and needing session today to talk through her personal and work-related  concerns about possible job positions not being renewed for next year. Lots of uncertainty creating more anxiety about her job but trying to stay grounded and not make anxious assumptions of "worst case scenarios." Concerns re: mom's health issues shared and talked through including what patient can do to help and what is out of patient's control, including how to accept some things are out of her control and be able to accept this and release them mentally/emotionally more effectively.  Trying to have healthier boundaries mother.  Showing some improvement in stress management.  Working towards healthier boundaries at work and within family.   Recognizing the increased need for some time for herself which she has tried to work on previously.  Discussed more ways today of being able to "say no" when she needs to and plans some intentional time/activities for herself.  Interventions: Cognitive Behavioral Therapy and Ego-Supportive  Long term goal: Reduce overall level, frequency, and intensity of the anxiety so that daily functioning is not impaired. 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 Short term goal: Identify, challenge, and replace anxious/fearful self-talk with positive, reality based, and empowering self talk.  Strategies: Teach the patient to implement "thought stopping" techniques for anxieties and worries that have been addressed but persist  Diagnosis:   ICD-10-CM   1. Generalized anxiety disorder  F41.1      Plan:   Patient motivated and actively participating in session today as she worked further on her anxiety and stressors relating to her job, personal life, and family.  Did well and working on these issues today and showing good perseverance even when things get tough.  Does need to carve out some time for herself to do some enjoyable activities and commits to doing this between now and her next appointment.Encouraged patient in her practice of positive/self affirming behaviors as noted in session including: Recognizing the progress she has made more often, staying in contact with people that are supportive, setting and keeping healthy boundaries with others, intentionally looking more for the positives each day, getting outside some and walking, allowing for  good sleep patterns, remain in the present focusing on what she can control or change, believing in herself or in her ability to make significant changes, and recognize the progress she shows working with goal-directed behaviors to move forward in a direction that supports her improved emotional health and  outlook.  Goal review and progress as challenges noted with patient.  Next appointment within 4 weeks.  This record has been created using [employer].  Chart creation errors have been sought, but may not always have been located and corrected.  Such creation errors do not reflect on the standard of medical care provided.    Mathis Fare, LCSW

## 2022-10-30 ENCOUNTER — Ambulatory Visit (INDEPENDENT_AMBULATORY_CARE_PROVIDER_SITE_OTHER): Payer: BC Managed Care – PPO | Admitting: Psychiatry

## 2022-10-30 DIAGNOSIS — F411 Generalized anxiety disorder: Secondary | ICD-10-CM | POA: Diagnosis not present

## 2022-10-30 NOTE — Progress Notes (Signed)
[facility] Counselor/Therapist Progress Note  Patient ID: Kim [name], MRN: [id number],    Date: 10/30/2022  Time Spent: 55 minutes   Treatment Type: Individual Therapy  Reported Symptoms: anxiety, stressed   Mental Status Exam:  Appearance:   Well Groomed     Behavior:  Appropriate, Sharing, and Motivated  Motor:  Normal  Speech/Language:   Clear and Coherent  Affect:  anxious  Mood:  anxious  Thought process:  goal directed  Thought content:    WNL  Sensory/Perceptual disturbances:    WNL  Orientation:  oriented to person, place, time/date, situation, day of week, month of year, year, and stated date of Oct 30, 2022  Attention:  Good  Concentration:  Good  Memory:  WNL  Fund of knowledge:   Good  Insight:    Good  Judgment:   Good  Impulse Control:  Good   Risk Assessment: Danger to Self:  No Self-injurious Behavior: No Danger to Others: No Duty to Warn:no Physical Aggression / Violence:No  Access to Firearms a concern: No  Gang Involvement:No   Subjective: Patient in session today reporting anxiety and feeling stressed related to personal, family, work, and the world chaos. Uncertainty in her job as far as next year and trying not to assume the negatives. Needing session today to vent and process her concerns, fears, and anxieties. Setting limits and trying not to assume worst case scenarios. Processed ways of better self-care especially when things happen at home that induce fear, concerns with aging parents, and work stressors. 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 Staying busy and connected with people does seem to help. Needing to stay involved with friends and church which helps.  Also encouraged walking and other physical exercise which has been helpful to her in the past.  Practice being able to set healthier limits and  "saying no" when she needs to say no without guilt.  Interventions: Cognitive Behavioral Therapy and Ego-Supportive  Long term goal: Reduce overall level, frequency, and intensity of the anxiety so that daily functioning is not impaired. 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 Short term goal: Identify, challenge, and replace anxious/fearful self-talk with positive, reality based, and empowering self talk.  Strategies: Teach the patient to implement "thought stopping" techniques for anxieties and worries that have been addressed but persist  Diagnosis:   ICD-10-CM   1. Generalized anxiety disorder  F41.1      Plan: Patient in today and motivated in session working more on her anxiety/stressors relating to personal life, family, job, and concerns about the world chaos/crises.  Has had some ups and downs since last appointment as a lot has been going on in her work environment and family which she was able to process today in session.  Is definitely making progress and needs to continue her work with goal-directed behaviors in order to continue moving forward. Encouraged patient and practicing more positive and self affirming behaviors as noted in session including: Staying in contact with people who are supportive of her, setting and keeping healthy boundaries with others, recognizing her progress made more often, intentionally looking more for the positives each day, getting outside some and walking daily, allowing for good sleep patterns, remain in the present focusing on what she can control  or change, believing in herself and in her ability to make significant changes, and realize the progress she shows working with goal-directed behaviors to move in a forward direction that supports her improved emotional health and overall wellbeing.  Goal review and progress/challenges noted with patient.  Next appointment within 3 to 4  weeks.   Mathis Fare, LCSW

## 2022-12-11 ENCOUNTER — Ambulatory Visit (INDEPENDENT_AMBULATORY_CARE_PROVIDER_SITE_OTHER): Payer: BC Managed Care – PPO | Admitting: Psychiatry

## 2022-12-11 DIAGNOSIS — F411 Generalized anxiety disorder: Secondary | ICD-10-CM

## 2022-12-11 NOTE — Progress Notes (Signed)
[facility] Counselor/Therapist Progress Note  Patient ID: Kim [name], MRN: [id number],    Date: 12/11/2022  Time Spent: 53 minutes   Treatment Type: Individual Therapy  Reported Symptoms: anxiety  Mental Status Exam:  Appearance:   Well Groomed     Behavior:  Appropriate, Sharing, and Motivated  Motor:  Normal  Speech/Language:   Clear and Coherent  Affect:  anxious  Mood:  anxious  Thought process:  goal directed  Thought content:    WNL  Sensory/Perceptual disturbances:    WNL  Orientation:  oriented to person, place, time/date, situation, day of week, month of year, year, and stated date of December 11, 2022  Attention:  Good  Concentration:  Good  Memory:  WNL  Fund of knowledge:   Good  Insight:    Good  Judgment:   Good  Impulse Control:  Good   Risk Assessment: Danger to Self:  No Self-injurious Behavior: No Danger to Others: No Duty to Warn:no Physical Aggression / Violence:No  Access to Firearms a concern: No  Gang Involvement:No   Subjective: Patient today in session reporting anxiety as main symptom and mostly related to personal, work, and family stressors. Attended friend's son's wedding recently; not feeling well during part of that time and added to her stress. Dealing with a lot of anxiety-producing "what-if's" personally, and re: work and family, which she shared and processed in session today. Did find out that she lost prior job but was offered a different position with [employer] in same school system and she accepted it. Needed part of session to share and work with some of her feelings about what all she is learning about the new position to which she has been assigned. Trying to better manage frustration, "second thoughts and overthinking", trying to not assume worst-case scenarios. Helped patient in also working to believe in herself more and refrain from assuming negative things into the future when she really doesn't know the facts yet.Issues with her  mother continue and patient trying to help without judging nor feeding into her anxiety symptoms, as we worked with specific examples in session.  Encouraged positive self-care and self talk.  Continues to deal with issues of aging parents.  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  Trying to stay more involved with other people which is a little more challenging during the summer, and she does enjoy some alone time to get things accomplished during summer.  Realizing that she needs to continue working on setting healthy limits and being able to say "no" without guilt or feeling that she has done something wrong.  Encouraged her continued involvement at her church which she has felt good about in the past.  Interventions: Cognitive Behavioral Therapy and Ego-Supportive  Long term goal: Reduce overall level, frequency, and intensity of the anxiety so that daily functioning is not impaired. 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 Short term goal: Identify, challenge, and replace anxious/fearful self-talk with positive, reality based, and empowering self talk.  Strategies: Teach the patient to implement "thought stopping" techniques for anxieties and worries that have been addressed but persist  Diagnosis:   ICD-10-CM   1. Generalized anxiety disorder  F41.1      Plan: Patient today in session focusing more on her anxiety mostly regarding family stressors, work, and personal life  issues.  Focus a lot on some of her "what if's" today with benefit.  Needs to continue working with goal-directed behaviors in order to keep moving in a forward direction. Encouraged patient in her practice of more positive and self affirming behaviors as noted in session including: Remaining in contact with people who are supportive of her, maintaining healthy boundaries with others, recognizing her progress  made more often, intentionally looking more for the positives each day, getting outside some and walking daily, allowing for good sleep patterns, remain in the present focusing on what she can control or change, believing in herself and her ability to make significant changes, and recognize the progress she shows working with goal-directed behaviors to move in a forward direction that supports her improved emotional health and outlook.  Goal review and progress/challenges noted with patient.  Next appointment within 3 to 4 weeks.   Mathis Fare, LCSW

## 2023-01-23 ENCOUNTER — Ambulatory Visit: Payer: BC Managed Care – PPO | Admitting: Psychiatry

## 2023-01-23 DIAGNOSIS — F411 Generalized anxiety disorder: Secondary | ICD-10-CM | POA: Diagnosis not present

## 2023-01-23 NOTE — Progress Notes (Signed)
[facility] Counselor/Therapist Progress Note  Patient ID: Kim [name], MRN: [id number],    Date: 01/23/2023  Time Spent: 53 minutes   Treatment Type: Individual Therapy  Reported Symptoms: anxiety  Mental Status Exam:  Appearance:   Casual and Neat     Behavior:  Appropriate, Sharing, and Motivated  Motor:  Normal  Speech/Language:   Clear and Coherent  Affect:  anxious  Mood:  anxious  Thought process:  goal directed  Thought content:    WNL  Sensory/Perceptual disturbances:    WNL  Orientation:  oriented to person, place, time/date, situation, day of week, month of year, year, and stated date of Aug. 13, 2024  Attention:  Good  Concentration:  Good  Memory:  WNL  Fund of knowledge:   Good  Insight:    Good  Judgment:   Good  Impulse Control:  Good   Risk Assessment: Danger to Self:  No Self-injurious Behavior: No Danger to Others: No Duty to Warn:no Physical Aggression / Violence:No  Access to Firearms a concern: No  Gang Involvement:No   Subjective: Patient today reporting main symptom to be anxiety and is mostly related to work/new job/uncertainties and "my mom and a lot of things I have needed to do with and for her but am needing to set boundaries".  Explained in more detail that the situation with her and mother is "developing more dependency, involving more money issues, and often results in guilt for patient".  Focused on some of the guilt issues that she raised today and how she can choose to not feel guilty by reflecting on the many ways she helps support her mother.  Situations in the past have demonstrated some manipulation within the relationship and patient finds it hard to set effective boundaries when needed.  Patient seeing more of the manipulative behavior within her mom as she gets older.  Mixture of frustration, anger, and some sadness which patient processed in session today.  Discussed strategies and better managing situations with her  mother including practicing some scenarios in session which seem to be very helpful for patient.  Trying to manage her anxiety and frustrations more effectively without feeling so guilty for setting clear boundaries.  Fears of aging.  Trying not to "get stuck in negative cycles".  Is making healthier decisions, although difficult at times.  Progressing, even in midst of increased stressors within family.  Encouraged patient to continue journaling between sessions.  Interventions: Cognitive Behavioral Therapy and Ego-Supportive  Long term goal: Reduce overall level, frequency, and intensity of the anxiety so that daily functioning is not impaired. 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 Short term goal: Identify, challenge, and replace anxious/fearful self-talk with positive, reality based, and empowering self talk.  Strategies: Teach the patient to implement "thought stopping" techniques for anxieties and worries that have been addressed but persist    Diagnosis:   ICD-10-CM   1. Generalized anxiety disorder  F41.1      Plan:  Patient in session today continuing her work to better manage anxiety and have healthier boundaries, most of which is related to work, family stressors (aging mother), and personal life issues.  Patient has been making progress and needs to continue her work with goal-directed behaviors in order to keep moving in a forward direction.  Reminded and encouraged patient in practicing more positive behaviors including: Maintaining her healthy boundaries with others, recognize  the progress she has already made, intentionally looking for the positives each day, getting outside some daily and walking, remain in contact with people who are supportive of her, allow for good sleep patterns, stay in the present focusing on what she can control or change, believing in herself and her ability to make significant changes, and  recognize the progress she shows working with goal-directed behaviors to move in a forward direction that supports her improved emotional health and outlook into the future.  Goal review and progress/challenges noted with patient.  Next appointment within 3 to 4 weeks.   Mathis Fare, LCSW

## 2023-02-20 ENCOUNTER — Ambulatory Visit (INDEPENDENT_AMBULATORY_CARE_PROVIDER_SITE_OTHER): Payer: BC Managed Care – PPO | Admitting: Psychiatry

## 2023-02-20 DIAGNOSIS — F411 Generalized anxiety disorder: Secondary | ICD-10-CM | POA: Diagnosis not present

## 2023-02-20 NOTE — Progress Notes (Signed)
[facility] Counselor/Therapist Progress Note  Patient ID: Kim [name], MRN: [id number],    Date: 02/20/2023  Time Spent: 55 minutes   Treatment Type: Individual Therapy  Reported Symptoms:  anxiety, work stress  Mental Status Exam:  Appearance:   Casual     Behavior:  Appropriate, Sharing, and Motivated  Motor:  Normal  Speech/Language:   Clear and Coherent  Affect:  anxious  Mood:  anxious  Thought process:  goal directed  Thought content:    WNL  Sensory/Perceptual disturbances:    WNL  Orientation:  oriented to person, place, time/date, situation, day of week, month of year, year, and stated date of Sept. 10, 2024  Attention:  Good  Concentration:  Good  Memory:  WNL  Fund of knowledge:   Good  Insight:    Good  Judgment:   Good  Impulse Control:  Good   Risk Assessment: Danger to Self:  No Self-injurious Behavior: No Danger to Others: No Duty to Warn:no Physical Aggression / Violence:No  Access to Firearms a concern: No  Gang Involvement:No   Subjective:   Patient today in session and reports anxiety is her main symptom and is related primarily to her work, adjusting to her new job, and uncertainties in her personal and family life.  Aging mother tends to have a lot of expectations of patient and patient realizing the need to set boundaries and has been working on this some already. 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 Wanting to better manage uncertainties especially at work and be able to feel more confident in new role especially with the many unexpected challenges that have already arisen. Does hope that conditions will change as the school year progresses. Needed session today to vent and process her frustrations, challenges in  new job, and identify strategies to take better care of herself which she felt was helpful. 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   Interventions: Cognitive Behavioral Therapy and Ego-Supportive  Long term goal: Reduce overall level, frequency, and intensity of the anxiety so that daily functioning is not impaired. 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 Short term goal: Identify, challenge, and replace anxious/fearful self-talk with positive, reality based, and empowering self talk.  Strategies: Teach the patient to implement "thought stopping" techniques for anxieties and worries that have been addressed but persist   Diagnosis:   ICD-10-CM   1. Generalized anxiety disorder  F41.1      Plan:   Patient in session and today reporting anxiety and increased stress with certain situation at her new job. As noted above, she does plan to get some clarification about some parts of her job that is needing attention and better understanding on the part of patient. She is showing good strength as she has taken on new job with unclear expectations.As noted above she is following through in getting more clarification on this. Overall she is showing progress and needs to continue working with goal-directed behaviors to move in a more positive direction. Reminded and encouraged patient in her practice of more  positive behaviors including: Maintaining her healthy boundaries with family and others, recognize the progress she has already made, intentionally look for the positives each day, getting outside some daily and walking, focusing on what she can change or control, stay in contact with supportive people, allow for good sleep patterns, believing in herself and her ability to make significant changes, and realize the progress she shows  working with goal-directed behaviors to move in a forward direction that supports her improved emotional health and overall wellbeing.  Goal review and progress/challenges noted with patient.  Next appointment within approximately 4 weeks.   Mathis Fare, LCSW

## 2023-04-09 ENCOUNTER — Ambulatory Visit (INDEPENDENT_AMBULATORY_CARE_PROVIDER_SITE_OTHER): Payer: BC Managed Care – PPO | Admitting: Psychiatry

## 2023-04-09 DIAGNOSIS — F411 Generalized anxiety disorder: Secondary | ICD-10-CM

## 2023-04-09 NOTE — Progress Notes (Signed)
[facility] Counselor/Therapist Progress Note  Patient ID: Kim [name], MRN: [id number],    Date: 04/09/2023  Time Spent: 53 minutes   Treatment Type: Individual Therapy  Reported Symptoms: work stress and anxiety    Mental Status Exam:  Appearance:   Casual     Behavior:  Appropriate, Sharing, and Motivated  Motor:  Normal  Speech/Language:   Clear and Coherent  Affect:  anxious  Mood:  anxious  Thought process:  goal directed  Thought content:    WNL  Sensory/Perceptual disturbances:    WNL  Orientation:  oriented to person, place, time/date, situation, day of week, month of year, year, and stated date of Oct. 28, 2024  Attention:  Good  Concentration:  Good  Memory:  WNL  Fund of knowledge:   Good  Insight:    Good  Judgment:   Good  Impulse Control:  Good   Risk Assessment: Danger to Self:  No Self-injurious Behavior: No Danger to Others: No Duty to Warn:no Physical Aggression / Violence:No  Access to Firearms a concern: No  Gang Involvement:No   Subjective:  Patient in session today reporting anxiety mostly related to work and some decisions she is making that are sensitive and talked about these today. (Not all details included in this note due to patient privacy needs.) Very tearful and assuming worst case scenarios re: work stressors. Is checking around for other options as current situation is becoming more unexpectedly stressful and unpredictable. Still having uncertainties in personal and family life and processed these also in detail which seemed helpful to patient. Processing her thoughts about job and herself, and putting good thought into some decisions she is considering. Talked through some frustrations currently and seemed to feel more purposeful and determined to push forward in whatever she decides. Continues with healthy boundaries. Positive self-care encouraged in the midst of this time of feeling unsettled, not valued, and facing  unrealistic expectations, which she tried to focus to today on each of these areas.   Interventions: Cognitive Behavioral Therapy and Ego-Supportive  Long term goal: Reduce overall level, frequency, and intensity of the anxiety so that daily functioning is not impaired. 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 Short term goal: Identify, challenge, and replace anxious/fearful self-talk with positive, reality based, and empowering self talk.  Strategies: Teach the patient to implement "thought stopping" techniques for anxieties and worries that have been addressed but persist  Diagnosis:   ICD-10-CM   1. Generalized anxiety disorder  F41.1      Plan:  Patient in for her session today as she continues to work on her anxiety, issues that have arisen in her new job, family concerns, and feelings about herself.  Has been working to gain clarification about certain parts of her job and has shown good strength and taking on a new job.  Patient has shown progress and needs to continue her work with goal-directed behaviors to keep moving in a more positive and emotionally healthier direction. Reminded and encouraged patient in her practice of more positive behaviors including: Maintaining her healthy boundaries with family and others, recognize the progress she has already made, intentionally look for the positives each day, getting outside some daily and walking, focusing on what she can change or control, stay in contact with supportive people, allow for good sleep patterns, believing in herself and her ability to make significant changes, and recognize  the progress she shows working with goal-directed behaviors to move in a forward direction that supports her improved emotional health and her outlook into the future.  Goal review and progress/challenges noted with patient.  Next appointment within approximately 4 weeks.   Mathis Fare,  LCSW

## 2023-05-09 ENCOUNTER — Ambulatory Visit: Payer: BC Managed Care – PPO | Admitting: Psychiatry

## 2023-05-09 DIAGNOSIS — F411 Generalized anxiety disorder: Secondary | ICD-10-CM

## 2023-05-09 NOTE — Progress Notes (Signed)
[facility] Counselor/Therapist Progress Note  Patient ID: Kim [name], MRN: [id number],    Date: 05/09/2023  Time Spent: 55 minutes   Treatment Type: Individual Therapy  Reported Symptoms: anxiety (work and home/family); has had some hopelessness but has improved; job stressors worse   Mental Status Exam:  Appearance:   Casual and Neat     Behavior:  Appropriate, Sharing, and Motivated  Motor:  Normal  Speech/Language:   Clear and Coherent  Affect:  anxious  Mood:  anxious  Thought process:  goal directed  Thought content:    WNL  Sensory/Perceptual disturbances:    WNL  Orientation:  oriented to person, place, time/date, situation, day of week, month of year, year, and stated date of Nov. 27, 2024  Attention:  Good  Concentration:  Good  Memory:  WNL  Fund of knowledge:   Good  Insight:    Good  Judgment:   Good  Impulse Control:  Good   Risk Assessment: Danger to Self:  No Self-injurious Behavior: No Danger to Others: No Duty to Warn:no Physical Aggression / Violence:No  Access to Firearms a concern: No  Gang Involvement:No   Subjective:   Patient today in session and reporting anxiety, work concerns, and family stressors with aging parents. Job situation is dramatically changing "and not for the better".  Has been placed in different assignment that "is not a good fit for her." Involves working in unhealthy environment. Talking today through so much frustration, anger, and some sadness.  Is looking for situations that would work better for her.  Wanting to feel more purposeful and that she is in a job that is a right fit for her.  Concerned re: privacy issues and patient discussed these in more detail. Less tearfulness.  The more she talked, the more strength she seemed to gain, along with belief in herself.  Also dealing with some uncertainties in family life with aging parents and trying to help as she is able.  Encouraged healthy decision making versus  impulsive decision making, positive self care, and keeping realistic expectations during this time that feels uncertain.  Also encouraged patient in following through on goal-directed behaviors as discussed in session.  Interventions: Cognitive Behavioral Therapy, Ego-Supportive, and Insight-Oriented  Long term goal: Reduce overall level, frequency, and intensity of the anxiety so that daily functioning is not impaired. 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 Short term goal: Identify, challenge, and replace anxious/fearful self-talk with positive, reality based, and empowering self talk.  Strategies: Teach the patient to implement "thought stopping" techniques for anxieties and worries that have been addressed but persist    Diagnosis:   ICD-10-CM   1. Generalized anxiety disorder  F41.1      Plan:  Patient today in session working further on her job related issues that are creating anxiety, her family concerns, and some feelings she has about herself and changes on what she is working.  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  Patient has shown progress and needs to continue working with goal-directed behaviors to keep moving in a positive and more hopeful direction.  Encouraged patient in her practice of more positive behaviors including: Intentionally looking more for positives each day, maintaining healthy boundaries with family and others, recognize the progress she has already made, getting outside some  daily and walking, focusing on what she can change her control, stay in contact with supportive people, allow for good sleep patterns, believing more in herself and her ability to make significant changes and maintain those changes, and realize the progress she shows working with goal-directed behaviors to move in a forward direction that supports her improved  emotional health and her overall wellbeing.  Goal review and progress/challenges noted with patient.  Next appointment within approximately 4 weeks.   Mathis Fare, LCSW

## 2023-07-03 ENCOUNTER — Ambulatory Visit: Payer: BC Managed Care – PPO | Admitting: Psychiatry

## 2023-07-11 ENCOUNTER — Ambulatory Visit: Payer: 59 | Admitting: Psychiatry

## 2023-07-11 DIAGNOSIS — F411 Generalized anxiety disorder: Secondary | ICD-10-CM | POA: Diagnosis not present

## 2023-07-11 NOTE — Progress Notes (Signed)
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  Work environment has its challenges as discussed in earlier session.  Patient trying to keep healthy boundaries and encouraging herself to help cope with the challenges at work. Some issues at work are improving very gradually and processed this more thoroughly today. 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 Mixed feelings overall and discussed this in session today. (Not all details included in this note due to patient privacy needs.) Has started to develop some healthier relationships in certain areas of her job. Intentionally trying to make the situation better while she is  there.  Has noticed some decrease in her anger and sadness as well as her frustration in the last few weeks.  Hopeful in terms of job situations.  No tearfulness.  Is looking and sounding stronger and having more belief in herself.  Shared a little more of family issues and she is setting better boundaries at this point especially with other adults in the family.  More confidence shown today.  Interventions: Cognitive Behavioral Therapy and Ego-Supportive  Long term goal: Reduce overall level, frequency, and intensity of the anxiety so that daily functioning is not impaired. 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 Short term goal: Identify, challenge, and replace anxious/fearful self-talk with positive, reality based, and empowering self talk.  Strategies: Teach the patient to implement "thought stopping" techniques for anxieties and worries that have been addressed but persist   Diagnosis:   ICD-10-CM   1. Generalized anxiety disorder  F41.1      Plan:  Patient today working in session further on her issues particularly centered around anxiety, family concerns, work situation, and the feelings she has about herself and making changes.  Patient has definitely shown progress and needs to continue working with goal-directed behaviors to keep moving in a positive and more hopeful direction for herself. Encouraged patient in her practice of more positive and self affirming behaviors including: Intentionally looking more for positives each day, maintaining healthy boundaries with family  and others, recognize the progress she has already made, getting outside some daily and walking, focusing on what she can change or control, stay in contact with supportive people, allow for good sleep patterns, believing more in herself and her ability to make significant changes and maintain those changes, and recognize the progress she shows working with  goal-directed behaviors to move in a forward direction that best supports her improved emotional health and her outlook into the future.  Goal review and progress/challenges noted with patient.  Next appointment within 4-5 weeks.   Mathis Fare, LCSW

## 2023-08-29 ENCOUNTER — Ambulatory Visit: Payer: 59 | Admitting: Psychiatry

## 2023-08-29 DIAGNOSIS — F411 Generalized anxiety disorder: Secondary | ICD-10-CM

## 2023-08-29 NOTE — Progress Notes (Signed)
 [facility] Counselor/Therapist Progress Note  Patient ID: Kim [name], MRN: [id number],    Date: 08/29/2023  Time Spent: 55 minutes   Treatment Type: Individual Therapy  Reported Symptoms: anxiety with personal/work/family/and upcoming new job.    Mental Status Exam:  Appearance:   Casual     Behavior:  Appropriate, Sharing, and Motivated  Motor:  Normal  Speech/Language:   Clear and Coherent  Affect:  anxious  Mood:  anxious  Thought process:  goal directed  Thought content:    WNL  Sensory/Perceptual disturbances:    WNL  Orientation:  oriented to person, place, time/date, situation, day of week, month of year, year, and stated date of August 29, 2023  Attention:  Good  Concentration:  Good  Memory:  WNL  Fund of knowledge:   Good  Insight:    Good  Judgment:   Good  Impulse Control:  Good   Risk Assessment: Danger to Self:  No Self-injurious Behavior: No Danger to Others: No Duty to Warn:no Physical Aggression / Violence:No  Access to Firearms a concern: No  Gang Involvement:No   Subjective:   Patient today in session and reporting anxiety and stress, but also encouraged "in some ways". Has been very stressed in her job and has recently obtained a new job that she is looking forward to, and will be starting in about another month. Needing to work today on some transition issues as she approached her upcoming job change. Enjoying her church and friends there. Mom still having lots of challenges and patient is  her primary support which can be stressful. Work environment currently continues to be very stressful, and is really looking forward to her new job. Continued work on healthy boundaries and coping skills in her current job. Working to build healthier relationships/friendships. Decreased anger and working to let go of things "sooner than later."  Overall, less sadness. Feeling more strength. Belief in herself improving. Setting healthier boundaries  especially at work. Increasing self-confidence.   Interventions: Cognitive Behavioral Therapy and Ego-Supportive  Long term goal: Reduce overall level, frequency, and intensity of the anxiety so that daily functioning is not impaired. 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 Short term goal: Identify, challenge, and replace anxious/fearful self-talk with positive, reality based, and empowering self talk.  Strategies: Teach the patient to implement "thought stopping" techniques for anxieties and worries that have been addressed but persist   Diagnosis:   ICD-10-CM   1. Generalized anxiety disorder  F41.1      Plan:   Patient working in session today particularly on her issues of anxiety, work situations, family concerns, and her feelings about herself as she continues to work on her goals and make positive changes.  Reminded and encouraged patient to be practicing more positive and self affirming behaviors as noted in sessions including: Intentionally look for more positives daily, maintain healthy boundaries with family and others, recognize the progress she has already made, getting outside some each day and walking, focusing what she can change or control, stay in contact with supportive people, allow for good sleep patterns, believing more in herself and her ability to make significant changes and maintain those changes, and realize the progress she shows working with goal-directed behaviors to move in a direction that best supports her improved emotional health and overall wellbeing.  This patient has definitely shown progress and needs to continue working with  her goals to keep moving in a healthier and more hopeful direction.  Goal review and progress/challenges noted with patient.  Next appointment within 4 to 5 weeks.   Mathis Fare, LCSW

## 2023-09-25 ENCOUNTER — Ambulatory Visit: Admitting: Psychiatry

## 2023-09-25 DIAGNOSIS — F411 Generalized anxiety disorder: Secondary | ICD-10-CM

## 2023-09-25 NOTE — Progress Notes (Signed)
 [facility] Counselor/Therapist Progress Note  Patient ID: Kim [name], MRN: [id number],    Date: 09/25/2023  Time Spent: 55 minutes   Treatment Type: Individual Therapy  Reported Symptoms: anxiety (personal, work, family, upcoming new job)   Mental Status Exam:  Appearance:   Casual     Behavior:  Appropriate, Sharing, and Motivated  Motor:  Normal  Speech/Language:   Clear and Coherent  Affect:  anxious  Mood:  anxious and anxious  Thought process:  normal  Thought content:    WNL  Sensory/Perceptual disturbances:    WNL  Orientation:  oriented to person, place, time/date, situation, day of week, month of year, year, and stated date of September 25, 2023  Attention:  Good  Concentration:  Good  Memory:  WNL  Fund of knowledge:   Good  Insight:    Good  Judgment:   Good  Impulse Control:  Good   Risk Assessment: Danger to Self:  No Self-injurious Behavior: No Danger to Others: No Duty to Warn:no Physical Aggression / Violence:No  Access to Firearms a concern: No  Gang Involvement:No   Subjective:  Patient working today in session further on her stress and anxiety and shares that she also is recognizing more progress. To start new job next week, mixed feelings but thankful for new job in different environment. Some sadness in leaving former coworkers. Processing sadness as well as challenges ahead. Also sharing and processing anxious thoughts about upcoming new job. Transition issues discussed further now that patient is beginning new job next week. Worked on her anxiety regarding several issues coming up in near future, which seemed helpful to patient. Church where she is involved continues to be welcoming and helpful.  She needs to work on building healthier friendships and relationships.  Healthier boundaries with parents.  Sensing more strength.  Self-confidence moving in a more positive direction.   Interventions: Cognitive Behavioral Therapy and  Ego-Supportive Long term goal: Reduce overall level, frequency, and intensity of the anxiety so that daily functioning is not impaired. 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 Short term goal: Identify, challenge, and replace anxious/fearful self-talk with positive, reality based, and empowering self talk.  Strategies: Teach the patient to implement "thought stopping" techniques for anxieties and worries that have been addressed but persist Diagnosis:   ICD-10-CM   1. Generalized anxiety disorder  F41.1      Plan:   Patient focused and working further in session today on her personal and family concerns, anxiety, work situations, and feelings about herself as she continues to work well on her goals and trying to maintain the positive changes she is already making. Encouraged patient in her practice of more positive and self affirming behaviors as noted in sessions including: Recognize the progress she has already made, look intentionally for more positives daily, maintain healthy boundaries with family and others, getting outside some each day and walking, focusing on what she can change her control, stay in contact with supportive people, allow for good sleep patterns, believing more in herself and her ability to make significant changes and maintain those changes, and recognize the progress she shows working with goal-directed behaviors to move in a direction that best supports her improved emotional health and her overall outlook into the future.  Kim [name] continues to make progress and needs to keep working with her goal-directed behaviors so as to keep moving  in a more healthy and hopeful direction.  Goal review and progress/challenges noted with patient.  Next appointment within 4 to 5 weeks.   Kelleen Patee, LCSW

## 2023-10-18 DIAGNOSIS — J3081 Allergic rhinitis due to animal (cat) (dog) hair and dander: Secondary | ICD-10-CM | POA: Diagnosis not present

## 2023-10-18 DIAGNOSIS — J301 Allergic rhinitis due to pollen: Secondary | ICD-10-CM | POA: Diagnosis not present

## 2023-10-18 DIAGNOSIS — J3089 Other allergic rhinitis: Secondary | ICD-10-CM | POA: Diagnosis not present

## 2023-10-23 DIAGNOSIS — J301 Allergic rhinitis due to pollen: Secondary | ICD-10-CM | POA: Diagnosis not present

## 2023-10-23 DIAGNOSIS — J3089 Other allergic rhinitis: Secondary | ICD-10-CM | POA: Diagnosis not present

## 2023-11-01 DIAGNOSIS — J301 Allergic rhinitis due to pollen: Secondary | ICD-10-CM | POA: Diagnosis not present

## 2023-11-01 DIAGNOSIS — J3089 Other allergic rhinitis: Secondary | ICD-10-CM | POA: Diagnosis not present

## 2023-11-01 DIAGNOSIS — J3081 Allergic rhinitis due to animal (cat) (dog) hair and dander: Secondary | ICD-10-CM | POA: Diagnosis not present

## 2023-11-14 DIAGNOSIS — J301 Allergic rhinitis due to pollen: Secondary | ICD-10-CM | POA: Diagnosis not present

## 2023-11-14 DIAGNOSIS — J3081 Allergic rhinitis due to animal (cat) (dog) hair and dander: Secondary | ICD-10-CM | POA: Diagnosis not present

## 2023-11-14 DIAGNOSIS — J3089 Other allergic rhinitis: Secondary | ICD-10-CM | POA: Diagnosis not present

## 2023-11-26 ENCOUNTER — Ambulatory Visit: Admitting: Psychiatry

## 2023-11-28 DIAGNOSIS — J3089 Other allergic rhinitis: Secondary | ICD-10-CM | POA: Diagnosis not present

## 2023-11-28 DIAGNOSIS — J301 Allergic rhinitis due to pollen: Secondary | ICD-10-CM | POA: Diagnosis not present

## 2023-11-28 DIAGNOSIS — J3081 Allergic rhinitis due to animal (cat) (dog) hair and dander: Secondary | ICD-10-CM | POA: Diagnosis not present

## 2023-12-05 DIAGNOSIS — J301 Allergic rhinitis due to pollen: Secondary | ICD-10-CM | POA: Diagnosis not present

## 2023-12-05 DIAGNOSIS — J3081 Allergic rhinitis due to animal (cat) (dog) hair and dander: Secondary | ICD-10-CM | POA: Diagnosis not present

## 2023-12-05 DIAGNOSIS — J3089 Other allergic rhinitis: Secondary | ICD-10-CM | POA: Diagnosis not present

## 2023-12-18 DIAGNOSIS — J3081 Allergic rhinitis due to animal (cat) (dog) hair and dander: Secondary | ICD-10-CM | POA: Diagnosis not present

## 2023-12-18 DIAGNOSIS — J3089 Other allergic rhinitis: Secondary | ICD-10-CM | POA: Diagnosis not present

## 2023-12-18 DIAGNOSIS — J301 Allergic rhinitis due to pollen: Secondary | ICD-10-CM | POA: Diagnosis not present

## 2023-12-27 DIAGNOSIS — J301 Allergic rhinitis due to pollen: Secondary | ICD-10-CM | POA: Diagnosis not present

## 2023-12-27 DIAGNOSIS — J3081 Allergic rhinitis due to animal (cat) (dog) hair and dander: Secondary | ICD-10-CM | POA: Diagnosis not present

## 2023-12-27 DIAGNOSIS — J3089 Other allergic rhinitis: Secondary | ICD-10-CM | POA: Diagnosis not present

## 2024-01-01 ENCOUNTER — Ambulatory Visit (INDEPENDENT_AMBULATORY_CARE_PROVIDER_SITE_OTHER): Admitting: Psychiatry

## 2024-01-01 DIAGNOSIS — F411 Generalized anxiety disorder: Secondary | ICD-10-CM

## 2024-01-01 DIAGNOSIS — J301 Allergic rhinitis due to pollen: Secondary | ICD-10-CM | POA: Diagnosis not present

## 2024-01-01 DIAGNOSIS — J3089 Other allergic rhinitis: Secondary | ICD-10-CM | POA: Diagnosis not present

## 2024-01-01 DIAGNOSIS — J3081 Allergic rhinitis due to animal (cat) (dog) hair and dander: Secondary | ICD-10-CM | POA: Diagnosis not present

## 2024-01-01 NOTE — Progress Notes (Signed)
 [facility] Counselor/Therapist Progress Note  Patient ID: Kim [name], MRN: [id number],    Date: 01/01/2024  Time Spent: 55 minutes   Treatment Type: Individual Therapy  Reported Symptoms: anxiety, some depression, work (new job) challenges, family deaths (uncle and stepmom),  Mental Status Exam:  Appearance:   Casual and Neat     Behavior:  Appropriate, Sharing, and Motivated  Motor:  Normal  Speech/Language:   Clear and Coherent  Affect:  Some anxiety and grief (loss of close uncle)  Mood:  anxious and depressed  Thought process:  normal  Thought content:    WNL  Sensory/Perceptual disturbances:    WNL  Orientation:  oriented to person, place, time/date, situation, day of week, month of year, year, and stated date of January 01, 2024  Attention:  Good  Concentration:  Good  Memory:  WNL  Fund of knowledge:   Good  Insight:    Good  Judgment:   Good  Impulse Control:  Good   Risk Assessment: Danger to Self:  No Self-injurious Behavior: No Danger to Others: No Duty to Warn:no Physical Aggression / Violence:No  Access to Firearms a concern: No  Gang Involvement:No   Subjective:  Patient today in session reporting anxiety, depression, grief over loss of stepmom and uncle, other family issues, and stressors of new job. Feels job transition has been going well. In midst of menopause and noticing some mood shifts at times. Thankful that new job is working out well for her. Today focusing more on family losses, relationship issues and need to fit in with some judgment of self or comparisons to others. Talking through some of her grief and determining what helps and hat does not help. Realizing how some issues from her past are still affecting her and is working on more letting go. Noticing some increased anxiety when I'm home on weekends. Processing today some of this anxiety as well as some depression that has built up some in midst of family deaths and new job. (Not all  details included in this note due to patient privacy needs.) Showing progress and feeling more positive about herself. Recognizing her progress more and that feels good to her. Feeling stronger emotionally and continues working on self-esteem and personal relationship building.   Interventions: Cognitive Behavioral Therapy, Solution-Oriented/Positive Psychology, and Ego-Supportive Long term goal: Reduce overall level, frequency, and intensity of the anxiety so that daily functioning is not impaired. 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 Short term goal: Identify, challenge, and replace anxious/fearful self-talk with positive, reality based, and empowering self talk.  Strategies: Teach the patient to implement thought stopping techniques for anxieties and worries that have been addressed but persist  Diagnosis:   ICD-10-CM   1. Generalized anxiety disorder  F41.1      Plan:   Patient actively participating in session today working further on some personal and family concerns/dynamics, anxiety, some depression and family grief issues.  Is actually showing more strength and progress.  Feeling better about herself.  Problem solved some issues today as noted above but overall is noticeably better.  She is in a new job and it is hard for her to have time off so patient was encouraged to call as needed for next appointment and she is in agreement with this.  Feeling better about herself and some of the positive changes she is making and continues to work on  moving forward. Encouraged patient in her practice of more positive and self affirming behaviors as noted in sessions including: Recognize the progress she has already made, look intentionally for more positives each day, maintain healthy boundaries with family and others, getting outside some each day and walking, focusing on what she can change or control, staying in contact with  supportive people, allow for good sleep patterns, believe more in herself and her ability to make significant changes and maintain those changes, and realize a progress she makes when working with goal-directed behaviors to move in a direction that best supports her improved emotional health and her overall outlook into the future going forward.  Kim [name] does continue to make progress and needs to keep working with goal-directed behaviors in order to keep moving forward and a more hopeful and healthier direction.  Goal review and progress/challenges noted with patient.  Next appointment within 4 to 5 weeks.   Barnie Bunde, LCSW

## 2024-01-15 DIAGNOSIS — J3089 Other allergic rhinitis: Secondary | ICD-10-CM | POA: Diagnosis not present

## 2024-01-15 DIAGNOSIS — J3081 Allergic rhinitis due to animal (cat) (dog) hair and dander: Secondary | ICD-10-CM | POA: Diagnosis not present

## 2024-01-15 DIAGNOSIS — J301 Allergic rhinitis due to pollen: Secondary | ICD-10-CM | POA: Diagnosis not present

## 2024-01-17 DIAGNOSIS — Z1231 Encounter for screening mammogram for malignant neoplasm of breast: Secondary | ICD-10-CM | POA: Diagnosis not present

## 2024-02-05 DIAGNOSIS — J3081 Allergic rhinitis due to animal (cat) (dog) hair and dander: Secondary | ICD-10-CM | POA: Diagnosis not present

## 2024-02-05 DIAGNOSIS — J301 Allergic rhinitis due to pollen: Secondary | ICD-10-CM | POA: Diagnosis not present

## 2024-02-05 DIAGNOSIS — J3089 Other allergic rhinitis: Secondary | ICD-10-CM | POA: Diagnosis not present

## 2024-02-14 DIAGNOSIS — J3089 Other allergic rhinitis: Secondary | ICD-10-CM | POA: Diagnosis not present

## 2024-02-14 DIAGNOSIS — F419 Anxiety disorder, unspecified: Secondary | ICD-10-CM | POA: Diagnosis not present

## 2024-02-14 DIAGNOSIS — M25551 Pain in right hip: Secondary | ICD-10-CM | POA: Diagnosis not present

## 2024-02-14 DIAGNOSIS — E039 Hypothyroidism, unspecified: Secondary | ICD-10-CM | POA: Diagnosis not present

## 2024-02-14 DIAGNOSIS — E7800 Pure hypercholesterolemia, unspecified: Secondary | ICD-10-CM | POA: Diagnosis not present

## 2024-02-14 DIAGNOSIS — Z0000 Encounter for general adult medical examination without abnormal findings: Secondary | ICD-10-CM | POA: Diagnosis not present

## 2024-02-14 DIAGNOSIS — J301 Allergic rhinitis due to pollen: Secondary | ICD-10-CM | POA: Diagnosis not present

## 2024-02-14 DIAGNOSIS — J3081 Allergic rhinitis due to animal (cat) (dog) hair and dander: Secondary | ICD-10-CM | POA: Diagnosis not present

## 2024-02-20 DIAGNOSIS — J3089 Other allergic rhinitis: Secondary | ICD-10-CM | POA: Diagnosis not present

## 2024-02-20 DIAGNOSIS — J301 Allergic rhinitis due to pollen: Secondary | ICD-10-CM | POA: Diagnosis not present

## 2024-03-04 DIAGNOSIS — D2261 Melanocytic nevi of right upper limb, including shoulder: Secondary | ICD-10-CM | POA: Diagnosis not present

## 2024-03-04 DIAGNOSIS — D225 Melanocytic nevi of trunk: Secondary | ICD-10-CM | POA: Diagnosis not present

## 2024-03-04 DIAGNOSIS — D2372 Other benign neoplasm of skin of left lower limb, including hip: Secondary | ICD-10-CM | POA: Diagnosis not present

## 2024-03-04 DIAGNOSIS — L814 Other melanin hyperpigmentation: Secondary | ICD-10-CM | POA: Diagnosis not present

## 2024-03-04 DIAGNOSIS — D2272 Melanocytic nevi of left lower limb, including hip: Secondary | ICD-10-CM | POA: Diagnosis not present

## 2024-03-04 DIAGNOSIS — D2262 Melanocytic nevi of left upper limb, including shoulder: Secondary | ICD-10-CM | POA: Diagnosis not present

## 2024-03-04 DIAGNOSIS — D2239 Melanocytic nevi of other parts of face: Secondary | ICD-10-CM | POA: Diagnosis not present

## 2024-03-04 DIAGNOSIS — L821 Other seborrheic keratosis: Secondary | ICD-10-CM | POA: Diagnosis not present

## 2024-03-04 DIAGNOSIS — D2271 Melanocytic nevi of right lower limb, including hip: Secondary | ICD-10-CM | POA: Diagnosis not present

## 2024-03-04 DIAGNOSIS — D1801 Hemangioma of skin and subcutaneous tissue: Secondary | ICD-10-CM | POA: Diagnosis not present

## 2024-03-05 DIAGNOSIS — J3089 Other allergic rhinitis: Secondary | ICD-10-CM | POA: Diagnosis not present

## 2024-03-05 DIAGNOSIS — J3081 Allergic rhinitis due to animal (cat) (dog) hair and dander: Secondary | ICD-10-CM | POA: Diagnosis not present

## 2024-03-05 DIAGNOSIS — J301 Allergic rhinitis due to pollen: Secondary | ICD-10-CM | POA: Diagnosis not present

## 2024-03-13 DIAGNOSIS — J3089 Other allergic rhinitis: Secondary | ICD-10-CM | POA: Diagnosis not present

## 2024-03-13 DIAGNOSIS — J301 Allergic rhinitis due to pollen: Secondary | ICD-10-CM | POA: Diagnosis not present

## 2024-03-13 DIAGNOSIS — J3081 Allergic rhinitis due to animal (cat) (dog) hair and dander: Secondary | ICD-10-CM | POA: Diagnosis not present

## 2024-03-20 DIAGNOSIS — J3081 Allergic rhinitis due to animal (cat) (dog) hair and dander: Secondary | ICD-10-CM | POA: Diagnosis not present

## 2024-03-20 DIAGNOSIS — J301 Allergic rhinitis due to pollen: Secondary | ICD-10-CM | POA: Diagnosis not present

## 2024-03-20 DIAGNOSIS — J3089 Other allergic rhinitis: Secondary | ICD-10-CM | POA: Diagnosis not present

## 2024-04-08 DIAGNOSIS — J3089 Other allergic rhinitis: Secondary | ICD-10-CM | POA: Diagnosis not present

## 2024-04-08 DIAGNOSIS — J301 Allergic rhinitis due to pollen: Secondary | ICD-10-CM | POA: Diagnosis not present

## 2024-04-17 DIAGNOSIS — J301 Allergic rhinitis due to pollen: Secondary | ICD-10-CM | POA: Diagnosis not present

## 2024-04-17 DIAGNOSIS — J3089 Other allergic rhinitis: Secondary | ICD-10-CM | POA: Diagnosis not present

## 2024-04-22 DIAGNOSIS — J301 Allergic rhinitis due to pollen: Secondary | ICD-10-CM | POA: Diagnosis not present

## 2024-04-22 DIAGNOSIS — J3081 Allergic rhinitis due to animal (cat) (dog) hair and dander: Secondary | ICD-10-CM | POA: Diagnosis not present

## 2024-04-22 DIAGNOSIS — J3089 Other allergic rhinitis: Secondary | ICD-10-CM | POA: Diagnosis not present

## 2024-05-06 DIAGNOSIS — J3081 Allergic rhinitis due to animal (cat) (dog) hair and dander: Secondary | ICD-10-CM | POA: Diagnosis not present

## 2024-05-06 DIAGNOSIS — J301 Allergic rhinitis due to pollen: Secondary | ICD-10-CM | POA: Diagnosis not present

## 2024-05-06 DIAGNOSIS — J3089 Other allergic rhinitis: Secondary | ICD-10-CM | POA: Diagnosis not present

## 2024-05-15 DIAGNOSIS — J3081 Allergic rhinitis due to animal (cat) (dog) hair and dander: Secondary | ICD-10-CM | POA: Diagnosis not present

## 2024-05-15 DIAGNOSIS — J3089 Other allergic rhinitis: Secondary | ICD-10-CM | POA: Diagnosis not present

## 2024-05-15 DIAGNOSIS — J301 Allergic rhinitis due to pollen: Secondary | ICD-10-CM | POA: Diagnosis not present

## 2024-05-22 DIAGNOSIS — J301 Allergic rhinitis due to pollen: Secondary | ICD-10-CM | POA: Diagnosis not present

## 2024-05-22 DIAGNOSIS — J3089 Other allergic rhinitis: Secondary | ICD-10-CM | POA: Diagnosis not present
